# Patient Record
Sex: Female | Born: 1960 | Race: White | Hispanic: No | Marital: Single | Smoking: Never smoker
Health system: Southern US, Community
[De-identification: ages and names within clinical notes are randomized; demographics above are authoritative.]

## PROBLEM LIST (undated history)

## (undated) DIAGNOSIS — Z973 Presence of spectacles and contact lenses: Secondary | ICD-10-CM

## (undated) DIAGNOSIS — G43909 Migraine, unspecified, not intractable, without status migrainosus: Secondary | ICD-10-CM

## (undated) DIAGNOSIS — M1990 Unspecified osteoarthritis, unspecified site: Secondary | ICD-10-CM

## (undated) DIAGNOSIS — N840 Polyp of corpus uteri: Secondary | ICD-10-CM

## (undated) DIAGNOSIS — J302 Other seasonal allergic rhinitis: Secondary | ICD-10-CM

## (undated) DIAGNOSIS — I493 Ventricular premature depolarization: Secondary | ICD-10-CM

## (undated) HISTORY — DX: Migraine, unspecified, not intractable, without status migrainosus: G43.909

## (undated) HISTORY — DX: Ventricular premature depolarization: I49.3

---

## 2002-02-10 ENCOUNTER — Other Ambulatory Visit: Admission: RE | Admit: 2002-02-10 | Discharge: 2002-02-10 | Payer: Self-pay | Admitting: Obstetrics and Gynecology

## 2003-05-25 ENCOUNTER — Other Ambulatory Visit: Admission: RE | Admit: 2003-05-25 | Discharge: 2003-05-25 | Payer: Self-pay | Admitting: Obstetrics and Gynecology

## 2004-09-11 ENCOUNTER — Other Ambulatory Visit: Admission: RE | Admit: 2004-09-11 | Discharge: 2004-09-11 | Payer: Self-pay | Admitting: Obstetrics and Gynecology

## 2007-05-26 ENCOUNTER — Encounter: Admission: RE | Admit: 2007-05-26 | Discharge: 2007-05-26 | Payer: Self-pay | Admitting: Geriatric Medicine

## 2009-04-27 ENCOUNTER — Ambulatory Visit: Payer: Self-pay | Admitting: Sports Medicine

## 2009-04-27 DIAGNOSIS — M722 Plantar fascial fibromatosis: Secondary | ICD-10-CM

## 2009-04-27 DIAGNOSIS — M7750 Other enthesopathy of unspecified foot: Secondary | ICD-10-CM | POA: Insufficient documentation

## 2011-06-06 ENCOUNTER — Encounter: Payer: Self-pay | Admitting: Internal Medicine

## 2011-06-06 ENCOUNTER — Ambulatory Visit (INDEPENDENT_AMBULATORY_CARE_PROVIDER_SITE_OTHER)
Admission: RE | Admit: 2011-06-06 | Discharge: 2011-06-06 | Disposition: A | Discharge status: Home / Self Care | Payer: BC Managed Care – PPO | Source: Ambulatory Visit | Attending: Internal Medicine | Admitting: Internal Medicine

## 2011-06-06 ENCOUNTER — Other Ambulatory Visit (INDEPENDENT_AMBULATORY_CARE_PROVIDER_SITE_OTHER): Payer: BC Managed Care – PPO

## 2011-06-06 ENCOUNTER — Ambulatory Visit (INDEPENDENT_AMBULATORY_CARE_PROVIDER_SITE_OTHER): Payer: BC Managed Care – PPO | Admitting: Internal Medicine

## 2011-06-06 ENCOUNTER — Encounter: Payer: Self-pay | Admitting: *Deleted

## 2011-06-06 VITALS — BP 132/88 | HR 90 | Temp 97.0°F | Ht 67.5 in | Wt 245.8 lb

## 2011-06-06 DIAGNOSIS — R0609 Other forms of dyspnea: Secondary | ICD-10-CM

## 2011-06-06 DIAGNOSIS — R0600 Dyspnea, unspecified: Secondary | ICD-10-CM

## 2011-06-06 DIAGNOSIS — R0989 Other specified symptoms and signs involving the circulatory and respiratory systems: Secondary | ICD-10-CM

## 2011-06-06 LAB — CBC WITH DIFFERENTIAL/PLATELET
Basophils Relative: 0.9 % (ref 0.0–3.0)
Eosinophils Absolute: 0.2 10*3/uL (ref 0.0–0.7)
HCT: 40 % (ref 36.0–46.0)
Lymphocytes Relative: 32.2 % (ref 12.0–46.0)
Lymphs Abs: 2.2 10*3/uL (ref 0.7–4.0)
MCHC: 34.2 g/dL (ref 30.0–36.0)
Monocytes Absolute: 0.4 10*3/uL (ref 0.1–1.0)
RBC: 4.45 Mil/uL (ref 3.87–5.11)

## 2011-06-06 LAB — BASIC METABOLIC PANEL
BUN: 23 mg/dL (ref 6–23)
GFR: 64.75 mL/min (ref >60.00)
Sodium: 139 mEq/L (ref 135–145)

## 2011-06-06 LAB — URINALYSIS
Hgb urine dipstick: NEGATIVE
Ketones, ur: NEGATIVE
Specific Gravity, Urine: 1.025 (ref 1.000–1.030)
Total Protein, Urine: NEGATIVE
Urobilinogen, UA: 0.2 (ref 0.0–1.0)
pH: 6 (ref 5.0–8.0)

## 2011-06-06 LAB — SEDIMENTATION RATE: Sed Rate: 17 mm/hr (ref 0–22)

## 2011-06-06 LAB — BRAIN NATRIURETIC PEPTIDE: Pro B Natriuretic peptide (BNP): 18 pg/mL (ref 0.0–100.0)

## 2011-06-06 IMAGING — CR DG CHEST 2V
2 series · 2 of 2 positions shown · non-contrast
Comparison: None

CLINICAL DATA: Hemoptysis

CHEST - 2 VIEW

[view not recorded (1 of 2)]
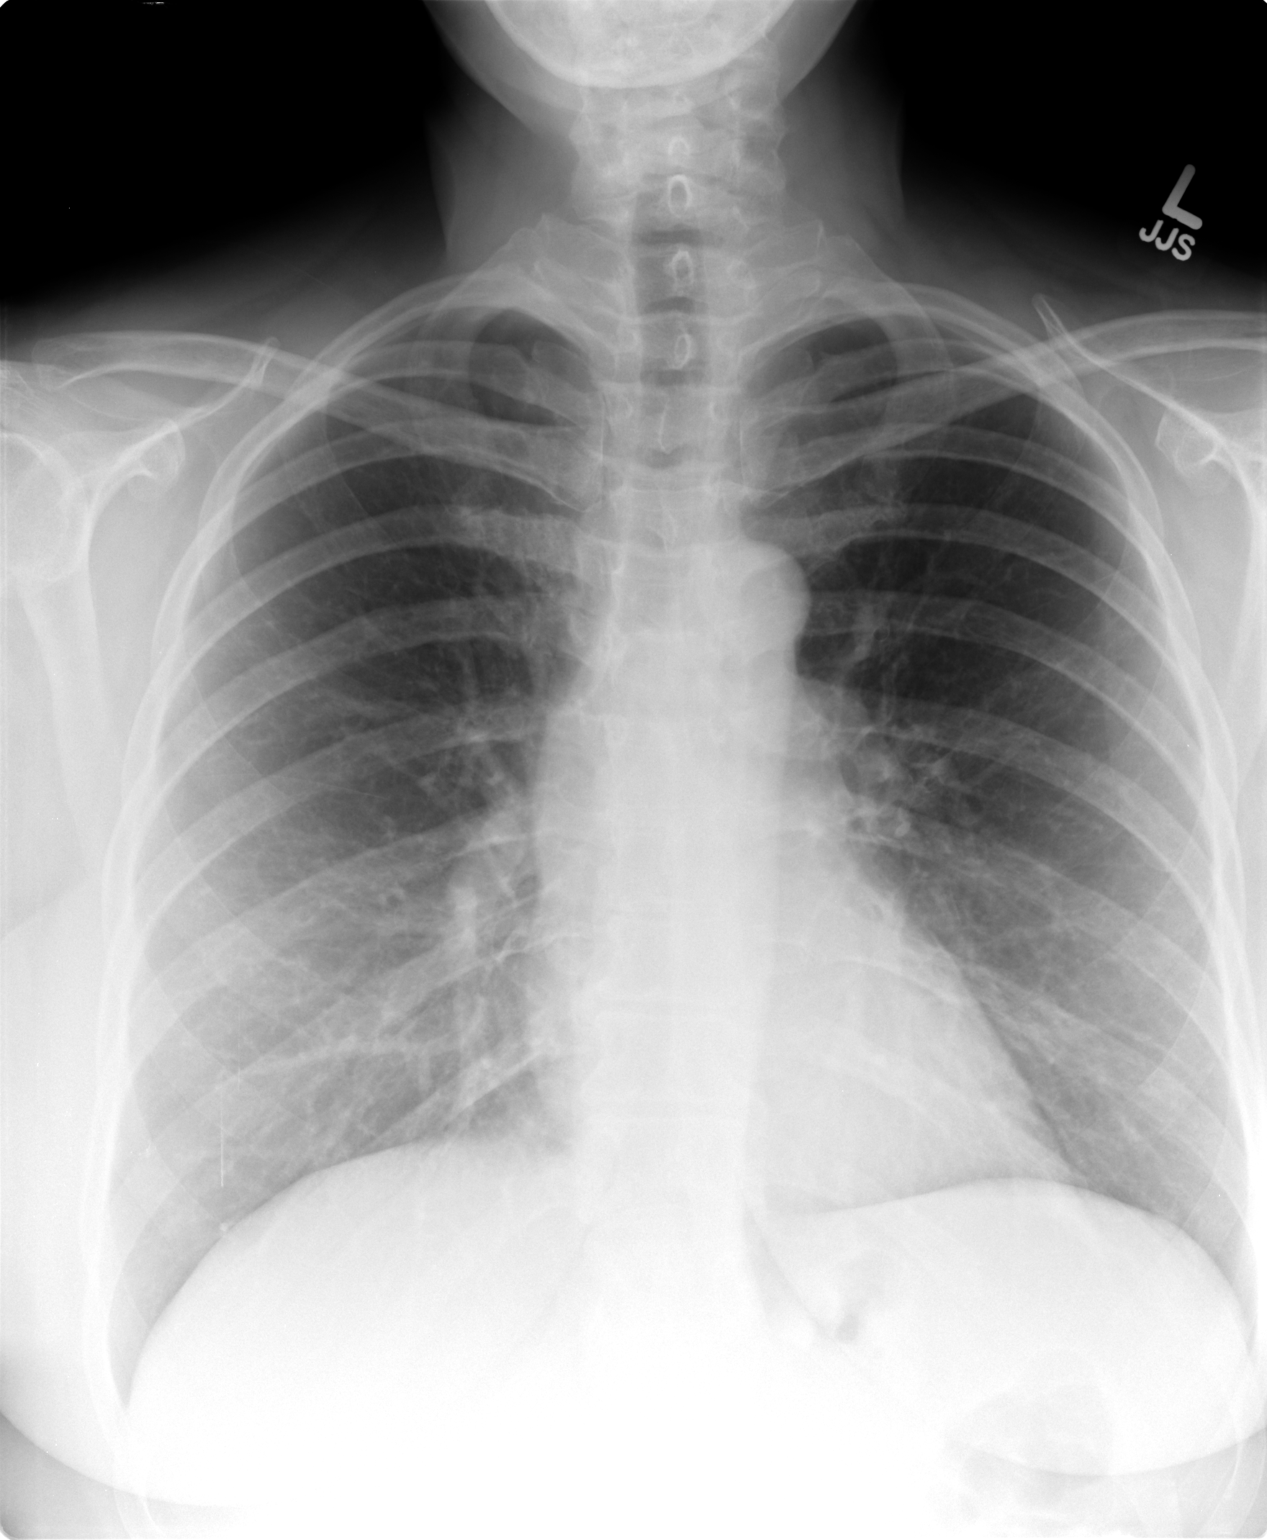

[view not recorded (2 of 2)]
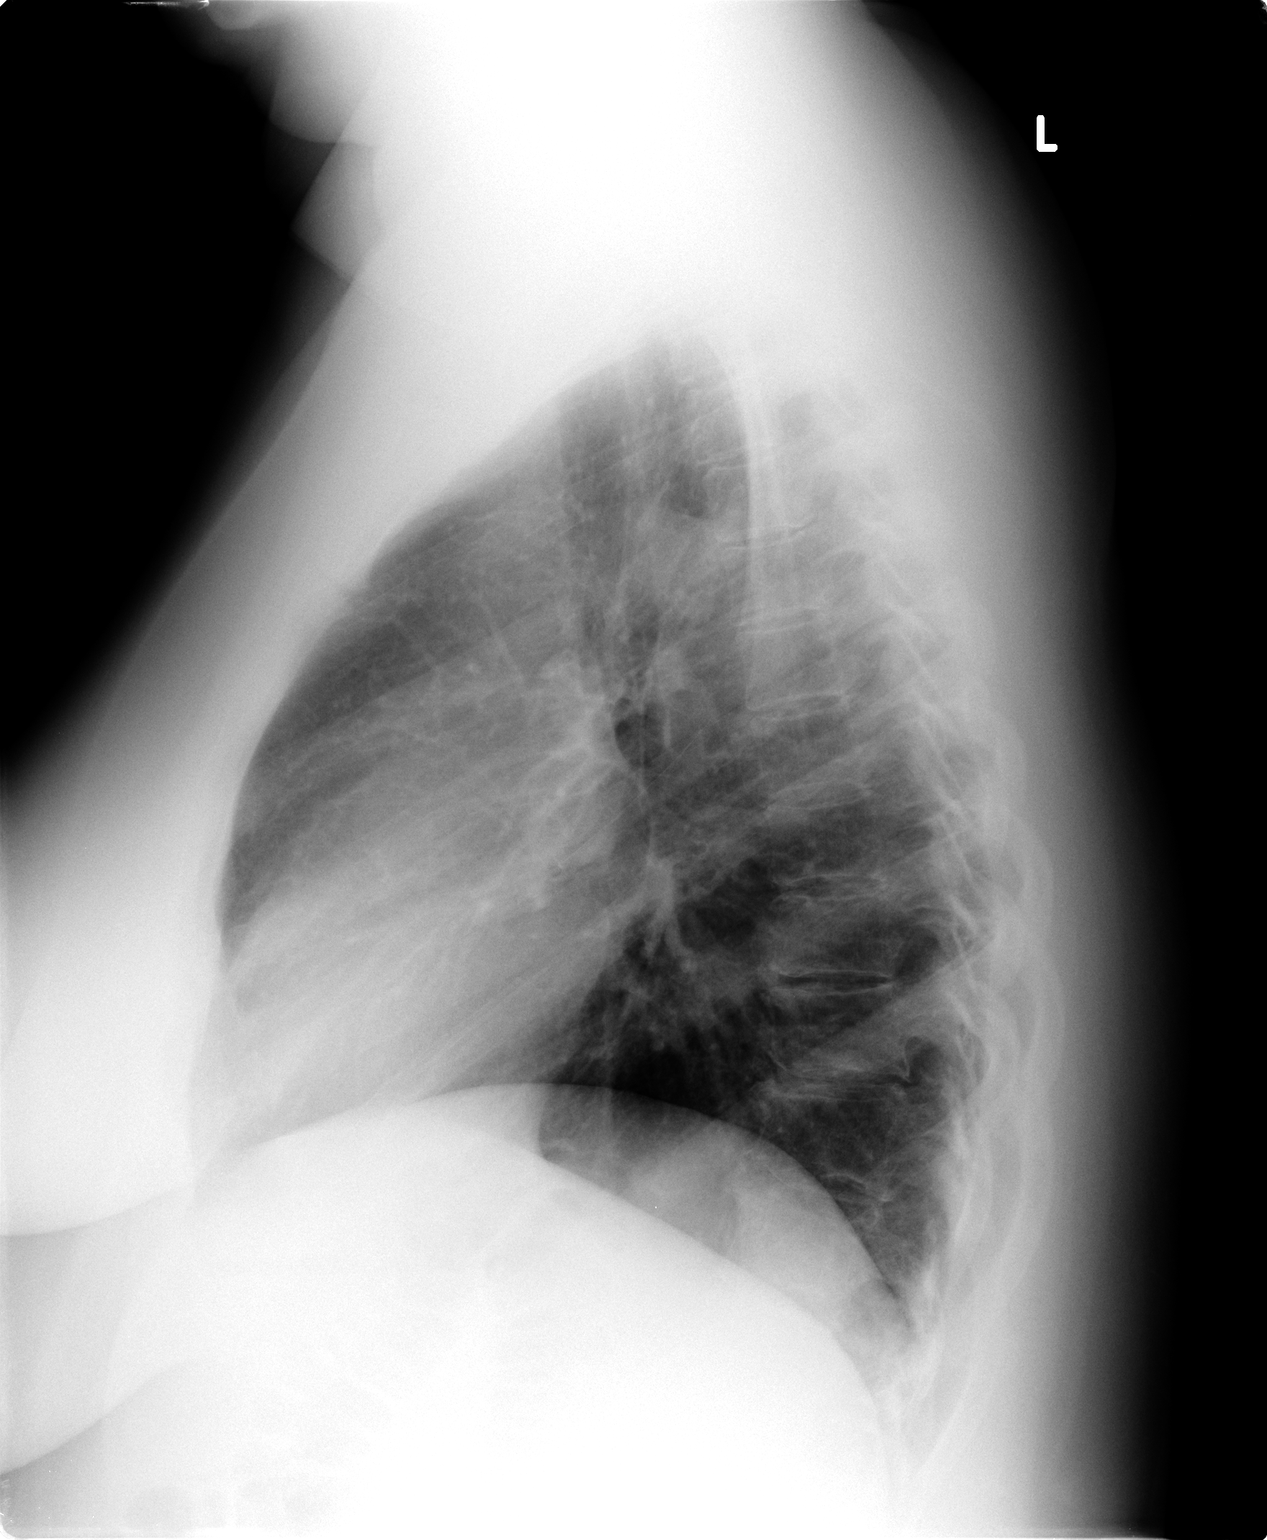

[2 of 2 positions shown; findings below may reference images not displayed]

FINDINGS: The heart size and mediastinal contours are within normal
limits.  Both lungs are clear.  The visualized skeletal structures
are unremarkable.
IMPRESSION: No active disease.

## 2011-06-06 MED ORDER — PREDNISONE (PAK) 10 MG PO TABS: ORAL_TABLET | ORAL | Status: AC

## 2011-06-06 NOTE — Progress Notes (Signed)
  Subjective:    Patient ID: Crystal Barber, female    DOB: 1961-03-29, 50 y.o.   MRN: [id number]  HPI  50 yowf from [place] never smoker but allergies since childhood on shots until teens never completely better then mostly seasonal eyes, nasal drainage able to stop medications summer and winter and evaluated by allergist 2011 pos cats mold self referred to pulmonary clinic for new breathing problems since Sept 2012  06/06/2011 Initial pulmonary office eval cc Sept p swimming x 3 weeks building up to 60 min on Oct 6th then 5 h later felt like the flu with aches/ chills/ coughing and wheezy typically worse p swimming and then symptoms all tapered off by Oct 13th reswam x  50 min felt fine then 5 hours exactly the same symptoms x coughed up blood sev tsps> better but still feels wheezy. Saw allergist by [facility] and" 6 rx's"  did not take any of them x nasonex and zyrtec > 75% better at day of ov. Assoc with nasal congestion but not epistaxis and no doe.  Sleeping ok without nocturnal  or early am exacerbation  of respiratory  c/o's or need for noct saba. Also denies any obvious fluctuation of symptoms with weather or environmental changes or other aggravating or alleviating factors except as outlined above      Review of Systems  Constitutional: Negative for fever, chills and unexpected weight change.  HENT: Positive for congestion. Negative for ear pain, nosebleeds, sore throat, rhinorrhea, sneezing, trouble swallowing, dental problem, voice change, postnasal drip and sinus pressure.   Eyes: Negative for visual disturbance.  Respiratory: Positive for cough and shortness of breath. Negative for choking.   Cardiovascular: Negative for chest pain and leg swelling.  Gastrointestinal: Negative for vomiting, abdominal pain and diarrhea.  Genitourinary: Negative for difficulty urinating.  Musculoskeletal: Negative for arthralgias.  Skin: Negative for rash.  Neurological: Negative for  tremors, syncope and headaches.  Hematological: Does not bruise/bleed easily.       Objective:   Physical Exam  amb tense wf nad  Wt 245 06/06/11 HEENT: nl dentition, turbinates, and orophanx. Nl external ear canals without cough reflex   NECK :  without JVD/Nodes/TM/ nl carotid upstrokes bilaterally   LUNGS: no acc muscle use, clear to A and P bilaterally without cough on insp or exp maneuvers   CV:  RRR  no s3 or murmur or increase in P2, no edema   ABD:  soft and nontender with nl excursion in the supine position. No bruits or organomegaly, bowel sounds nl  MS:  warm without deformities, calf tenderness, cyanosis or clubbing  SKIN: warm and dry without lesions    NEURO:  alert, approp, no deficits   cxr 06/06/11 No active disease.  ESR, Eos, u/a all neg      Assessment & Plan:

## 2011-06-06 NOTE — Patient Instructions (Addendum)
Please remember to go to the lab and x-ray department downstairs for your tests - we will call you with the results when then are available.   Try prilosec 20mg   Take 30-60 min before first meal of the day and Pepcid 20 mg one bedtime until cough is completely gone for at least a week without the need for cough suppression  I think of reflux for chronic cough like I do oxygen for fire (doesn't cause the fire but once you get the oxygen suppressed it usually goes away regardless of the exact cause).   GERD (REFLUX)  is an extremely common cause of respiratory symptoms, many times with no significant heartburn at all.    It can be treated with medication, but also with lifestyle changes including avoidance of late meals, excessive alcohol, smoking cessation, and avoid fatty foods, chocolate, peppermint, colas, red wine, and acidic juices such as orange juice.  NO MINT OR MENTHOL PRODUCTS SO NO COUGH DROPS  USE SUGARLESS CANDY INSTEAD (jolley ranchers or Stover's)  NO OIL BASED VITAMINS - use powdered substitutes - NO FISH OIL  If your condition worsens at all  Prednisone 10 mg take  4 each am x 2 days,   2 each am x 2 days,  1 each am x2days and stop   I agree you should not expose yourself to the pool at [facility] - see letter.  Return for any flare of symptoms

## 2011-06-07 ENCOUNTER — Telehealth: Payer: Self-pay | Admitting: Internal Medicine

## 2011-06-07 NOTE — Telephone Encounter (Signed)
Pt aware of lab and cxr results per dr wert's result notes--all labs and cxr are normal, no changes in recs made at [facility]

## 2011-06-07 NOTE — Assessment & Plan Note (Addendum)
Reproducible severe pulmonary and systemic complaints p exposure to pool atmosphere ? Some form of HSP since there was a delay and the cxr though normal does exclude this dx.   Since still having some HS  " wheeze" > Agree with 6 days of prednisone and empiric rx of gerd short term but unless symptoms recur  Or flare with other forms of exercise the key  Here is to avoid this environment and find other forms of ex >  If recurs  cpst is the next step.  Note she is going to [place] next month and needs to pace herself at high altittudes and stay on gerd rx/ diet while there if possible to avoid confusion in interpretation of her symptoms.  See instructions for specific recommendations which were reviewed directly with the patient who was given a copy with highlighter outlining the key components.

## 2013-09-11 ENCOUNTER — Other Ambulatory Visit: Payer: Self-pay | Admitting: Obstetrics and Gynecology

## 2013-09-11 DIAGNOSIS — N6489 Other specified disorders of breast: Secondary | ICD-10-CM

## 2013-09-17 ENCOUNTER — Ambulatory Visit
Admission: RE | Admit: 2013-09-17 | Discharge: 2013-09-17 | Disposition: A | Discharge status: Home / Self Care | Payer: 59 | Source: Ambulatory Visit | Attending: Obstetrics and Gynecology | Admitting: Obstetrics and Gynecology

## 2013-09-17 DIAGNOSIS — N6489 Other specified disorders of breast: Secondary | ICD-10-CM

## 2013-09-17 IMAGING — US US BREAST*L* LIMITED INC AXILLA
1 series · 8 of 8 positions shown · non-contrast
Comparison: Previous examinations.

CLINICAL DATA: Left axillary fullness 4 weeks ago following an
upper respiratory tract infection. The fullness has subsequently
resolved.

EXAM:
DIGITAL DIAGNOSTIC  BILATERAL MAMMOGRAM WITH CAD
ULTRASOUND LEFT BREAST

[Series 1: us breast*left* limited inc axilla · 8 of 8 slices shown]
[im 1/8]
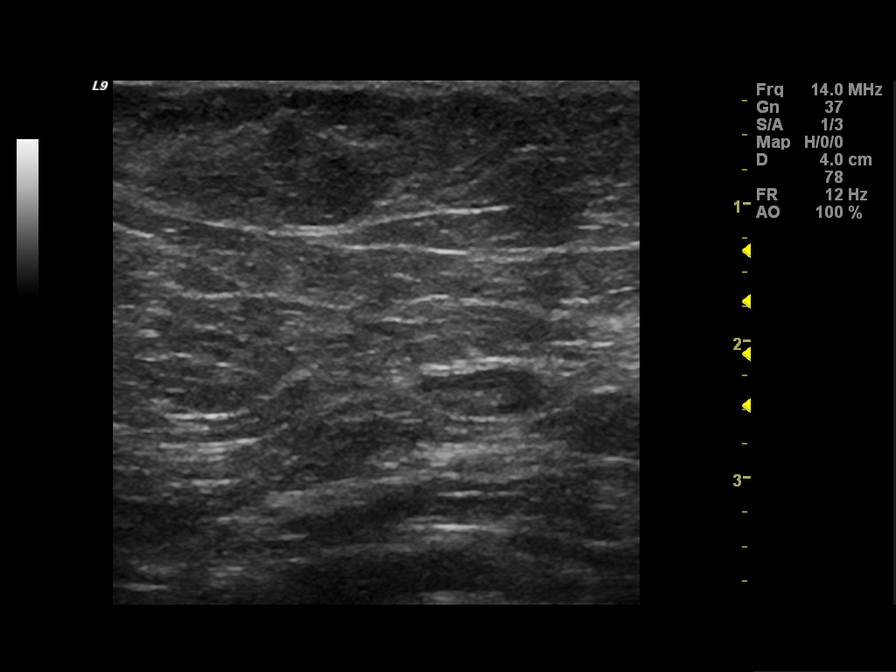
[im 2/8]
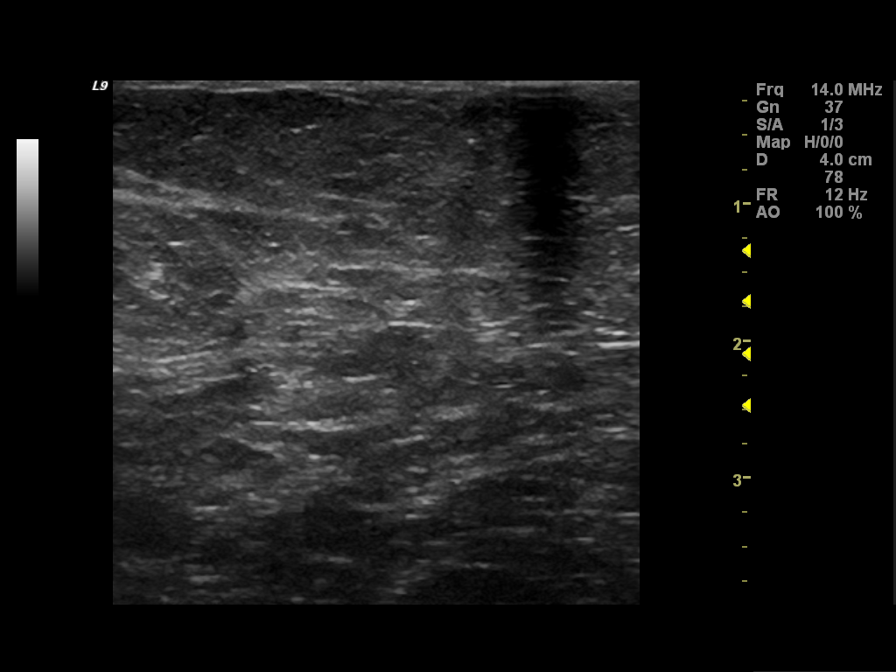
[im 3/8]
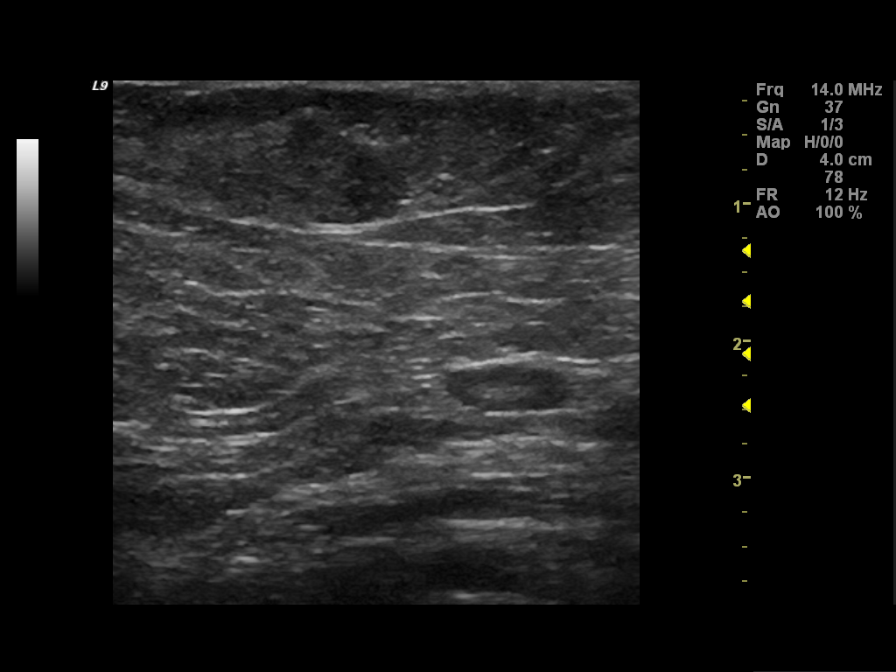
[im 4/8]
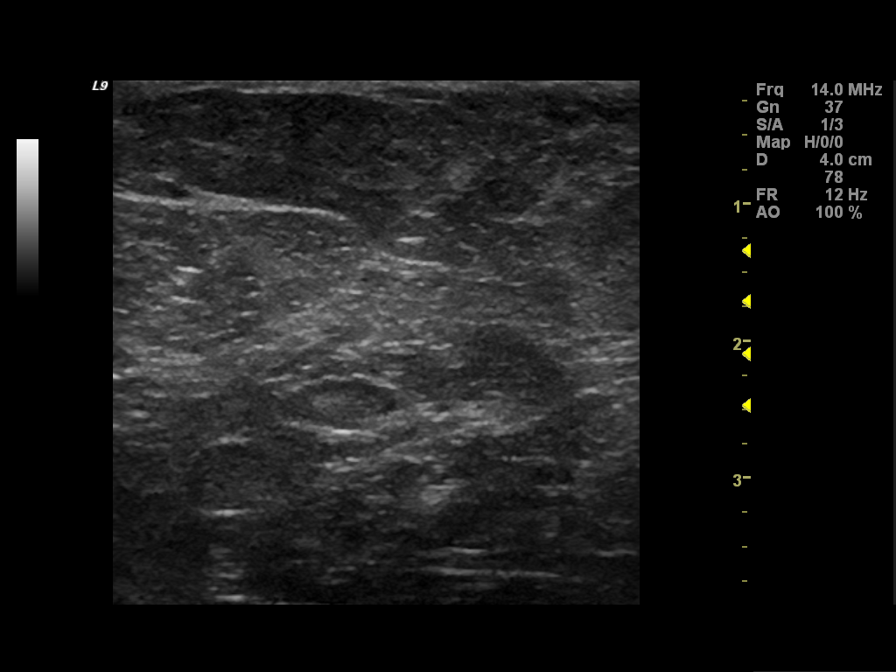
[im 5/8]
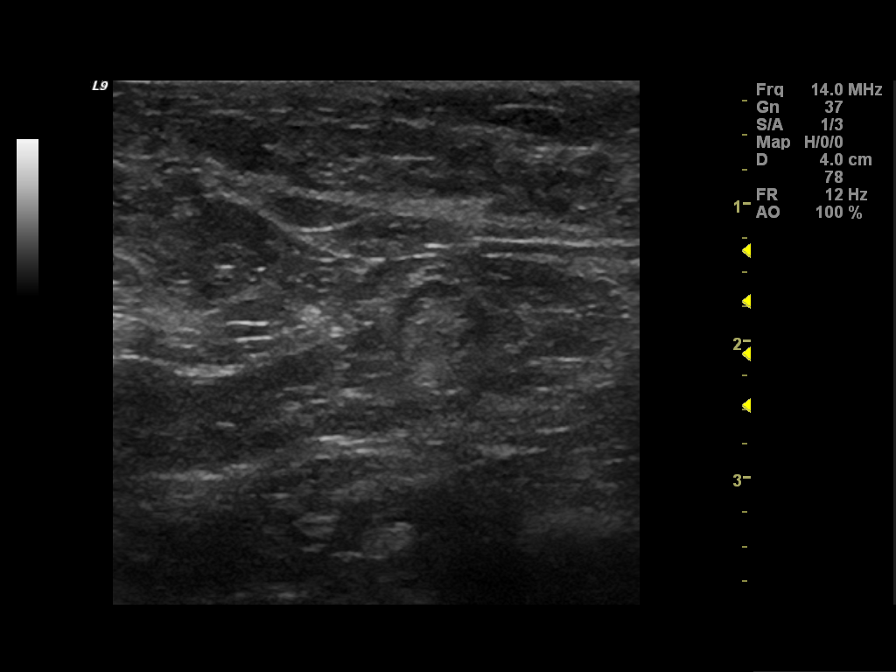
[im 6/8]
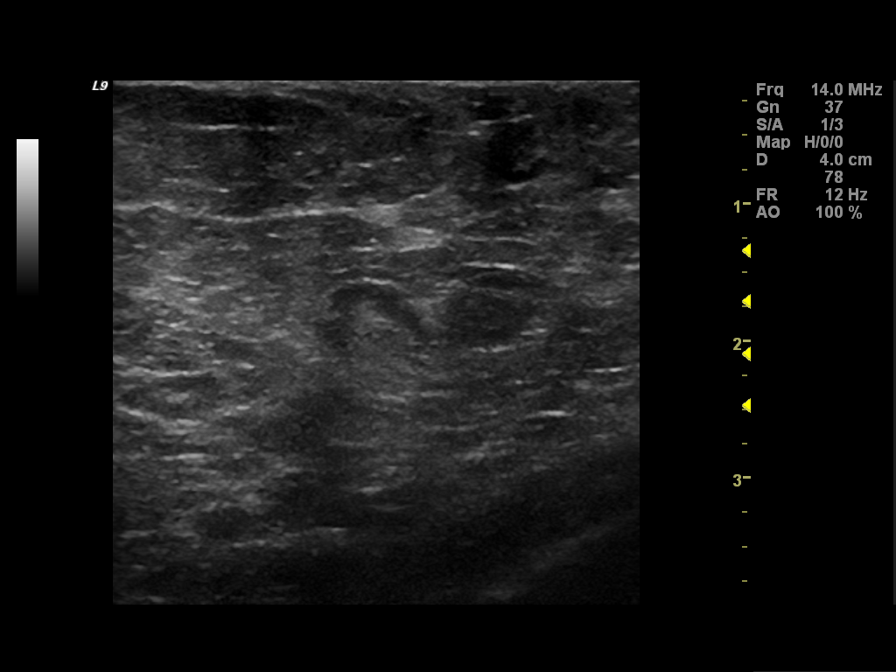
[im 7/8]
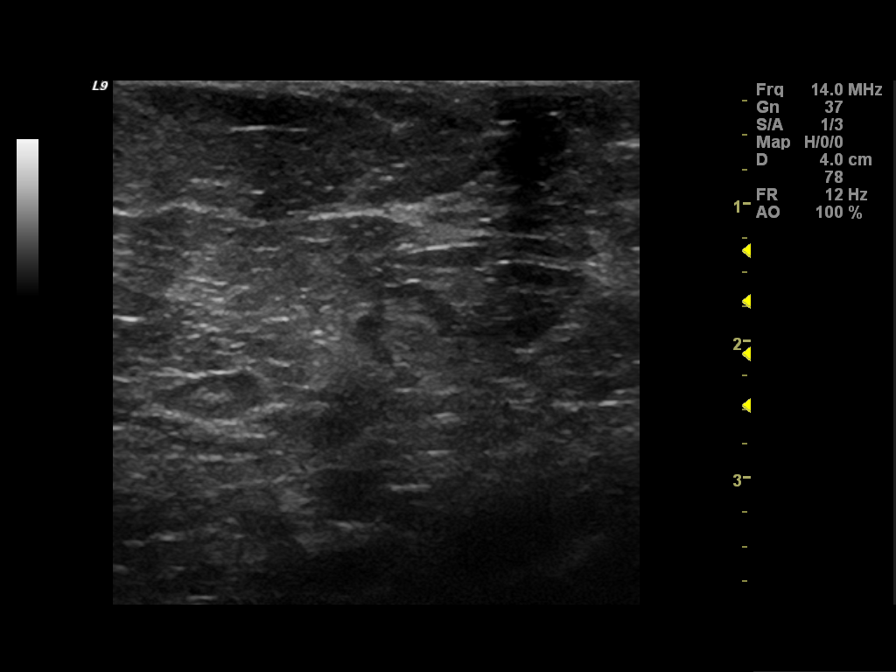
[im 8/8]
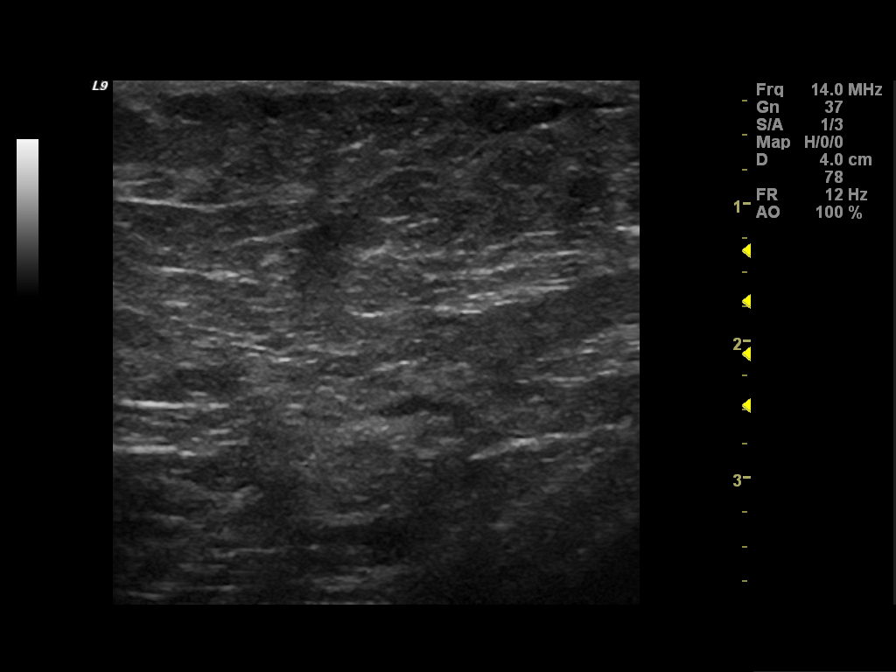

[8 of 8 positions shown; findings below may reference images not displayed]

ACR Breast Density Category d: The breast tissue is extremely dense,
which lowers the sensitivity of mammography.
FINDINGS: Stable mammographic appearance of the breasts with no findings
suspicious for malignancy. Stable normal appearing bilateral
axillary lymph nodes.

Mammographic images were processed with CAD.

On physical exam, no mass is palpable in the inferior left axilla in
the area of recent fullness. The patient is tender to palpation in
that region.

Ultrasound is performed, showing normal appearing left axillary
lymph nodes in the area of tenderness and recent fullness in the
inferior left axilla.
IMPRESSION: No evidence of malignancy.

RECOMMENDATION:
Bilateral screening mammogram in 1 year.

I have discussed the findings and recommendations with the patient.
Results were also provided in writing at the conclusion of the
visit. If applicable, a reminder letter will be sent to the patient
regarding the next appointment.

BI-RADS CATEGORY  1: Negative.

## 2013-09-17 IMAGING — MG MM DIGITAL DIAGNOSTIC BILAT CAD
5 series · 5 of 5 positions shown · non-contrast
Comparison: Previous examinations.

CLINICAL DATA: Left axillary fullness 4 weeks ago following an
upper respiratory tract infection. The fullness has subsequently
resolved.

EXAM:
DIGITAL DIAGNOSTIC  BILATERAL MAMMOGRAM WITH CAD
ULTRASOUND LEFT BREAST

[R CC]
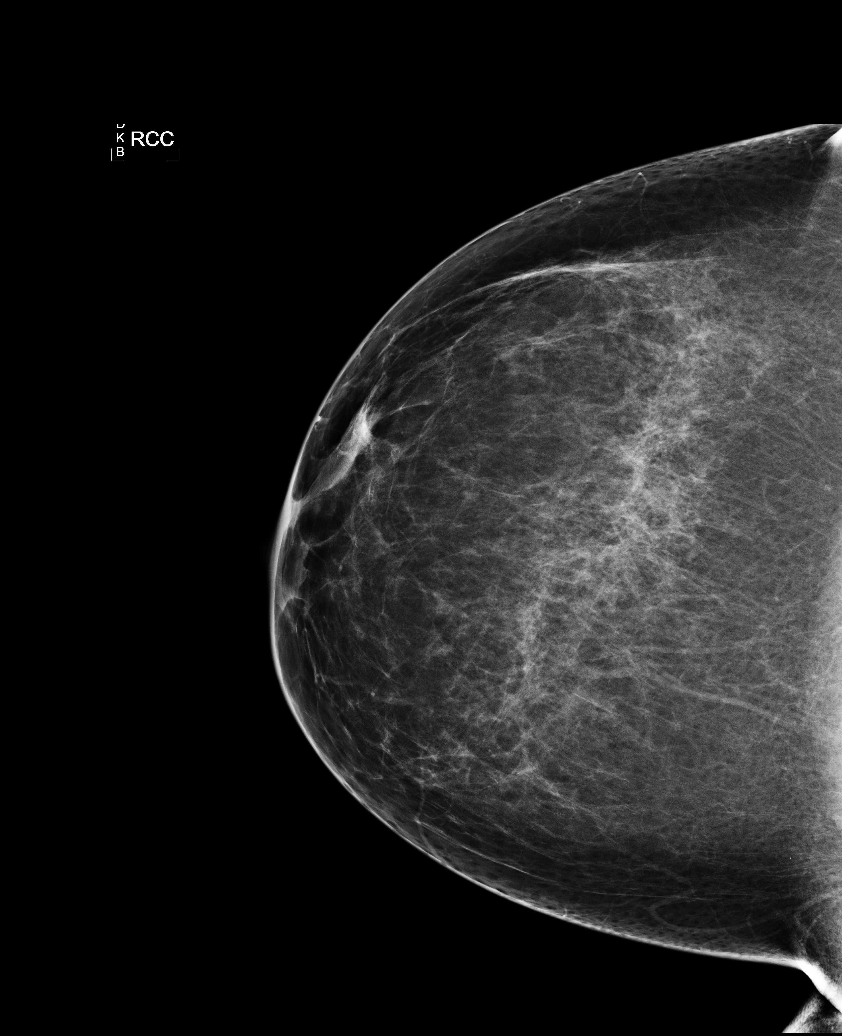

[L CC]
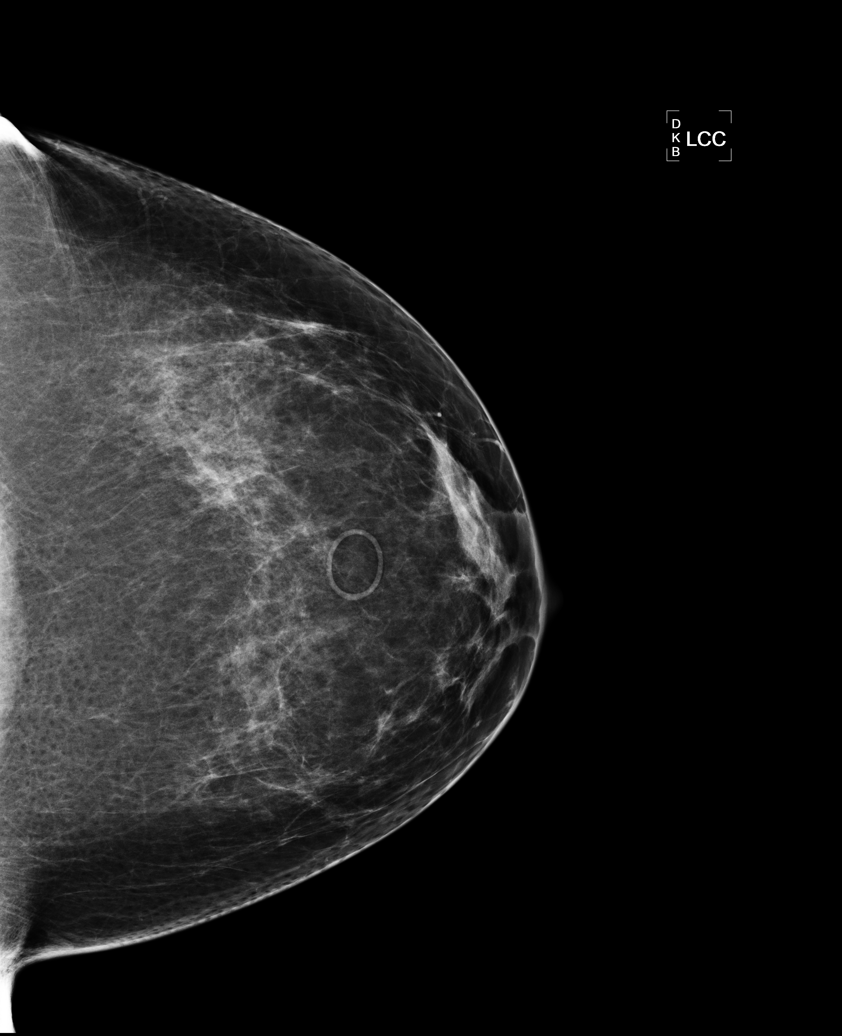

[L MLO]
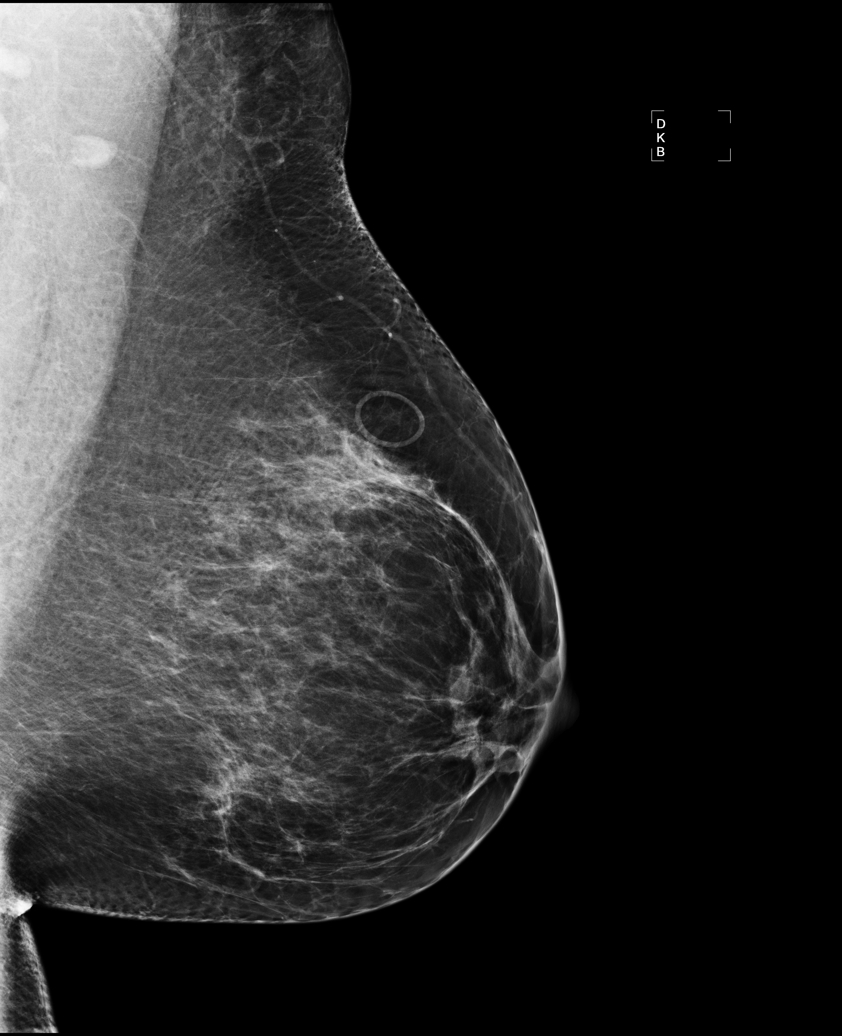

[R MLO]
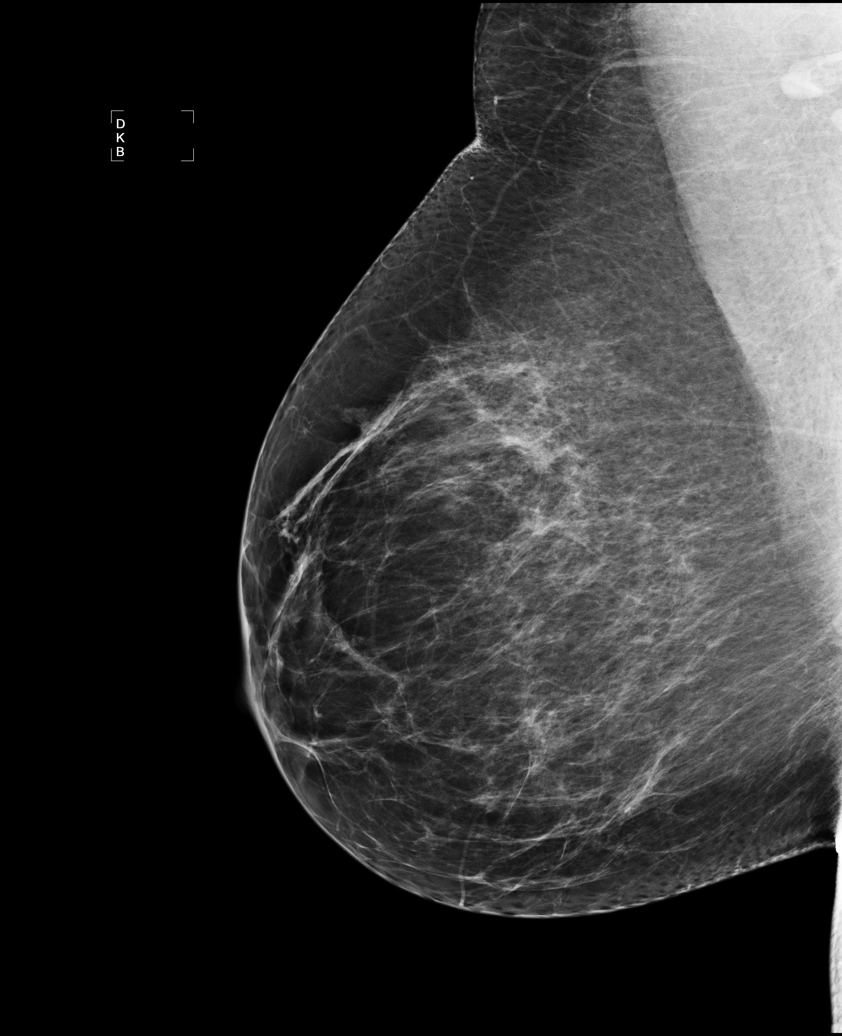

[L AT]
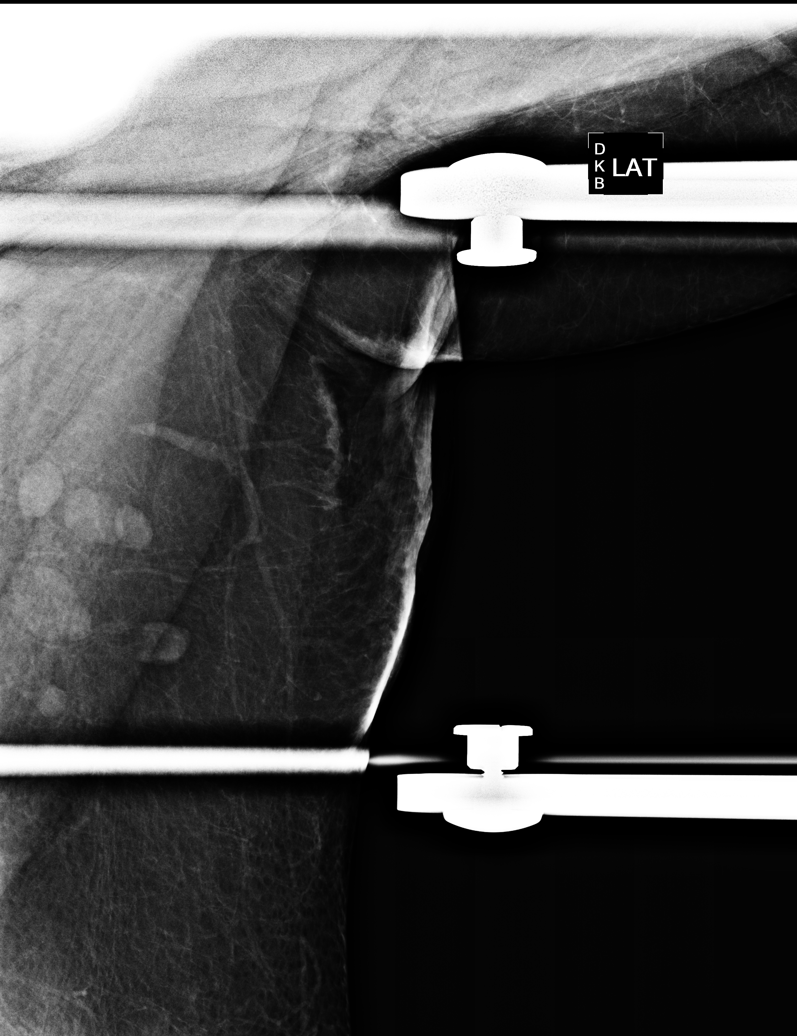

[5 of 5 positions shown; findings below may reference images not displayed]

ACR Breast Density Category d: The breast tissue is extremely dense,
which lowers the sensitivity of mammography.
FINDINGS: Stable mammographic appearance of the breasts with no findings
suspicious for malignancy. Stable normal appearing bilateral
axillary lymph nodes.

Mammographic images were processed with CAD.

On physical exam, no mass is palpable in the inferior left axilla in
the area of recent fullness. The patient is tender to palpation in
that region.

Ultrasound is performed, showing normal appearing left axillary
lymph nodes in the area of tenderness and recent fullness in the
inferior left axilla.
IMPRESSION: No evidence of malignancy.

RECOMMENDATION:
Bilateral screening mammogram in 1 year.

I have discussed the findings and recommendations with the patient.
Results were also provided in writing at the conclusion of the
visit. If applicable, a reminder letter will be sent to the patient
regarding the next appointment.

BI-RADS CATEGORY  1: Negative.

## 2013-12-04 HISTORY — PX: HYSTEROSCOPY: SHX211

## 2013-12-21 ENCOUNTER — Encounter (HOSPITAL_BASED_OUTPATIENT_CLINIC_OR_DEPARTMENT_OTHER): Payer: Self-pay | Admitting: *Deleted

## 2013-12-21 LAB — CBC
HCT: 40 % (ref 36.0–46.0)
HEMOGLOBIN: 13.9 g/dL (ref 12.0–15.0)
MCH: 30.3 pg (ref 26.0–34.0)
MCHC: 34.8 g/dL (ref 30.0–36.0)
MCV: 87.3 fL (ref 78.0–100.0)
Platelets: 191 10*3/uL (ref 150–400)
RBC: 4.58 MIL/uL (ref 3.87–5.11)
RDW: 12.7 % (ref 11.5–15.5)
WBC: 7.6 10*3/uL (ref 4.0–10.5)

## 2013-12-21 NOTE — Progress Notes (Signed)
NPO AFTER MN. ARRIVE AT 0600. PT WILL LAB WORK DONE TODAY.

## 2013-12-21 NOTE — H&P (Signed)
  Patient name Crystal Barber, Crystal Barber DICTATION#  [id number] CSN# [id number]  Darlyn Chamber, MD 12/21/2013 10:34 AM

## 2013-12-22 NOTE — H&P (Signed)
NAMETARAANN, OLTHOFF NO.:  [id number]  MEDICAL RECORD NO.:  [id number]  LOCATION:                                 FACILITY:  PHYSICIAN:  Darlyn Chamber, M.D.   DATE OF BIRTH:  05/04/1961  DATE OF ADMISSION:  12/24/2013 DATE OF DISCHARGE:                             HISTORY & PHYSICAL   DATE OF SURGERY:  Dec 24, 2013, at [facility] Outpatient area at [place]:  The patient is a 53 year old, gravida 0, para 0, postmenopausal patient presents for hysteroscopy and cervical dilation and uterine curettage.  In relation to the present admission, the patient is postmenopausal, did have an episode of postmenopausal bleeding.  She underwent a saline infusion ultrasound here in the office, and we did find a large endometrial polyp.  Both adnexa are unremarkable.  She now presents for hysteroscopy.  ALLERGIES:  She has no known drug allergies.  MEDICATIONS:  Zyrtec and Nasonex.  OB HISTORY:  No past obstetrical surgical history.  PAST HISTORY:  No significant medical issues besides migraine headaches.  FAMILY HISTORY:  Noncontributory.  SOCIAL HISTORY:  No tobacco.  Occasional alcohol use.  REVIEW OF SYSTEMS:  Noncontributory.  PHYSICAL EXAMINATION:  VITAL SIGNS:  The patient is afebrile, stable vital signs. HEENT:  The is patient is normocephalic.  Pupils equal, round, reactive to light and accommodation.  Extraocular movements were intact.  Sclerae clear.  Oropharynx clear. NECK:  Without thyromegaly. BREASTS:  Not examined. LUNGS:  Clear. CARDIOVASCULAR:  Regular rhythm, rate without murmurs or gallops.  No carotid or abdominal bruits. ABDOMEN:  Benign.  No mass, organomegaly, or tenderness. PELVIC:  Normal external genitalia.  Vaginal mucosa is clear.  Cervix unremarkable.  Uterus normal size, shape, and contour.  Adnexa free of mass or tenderness. EXTREMITIES:  Trace edema. NEUROLOGIC:  Grossly normal  limits.  IMPRESSION:  Postmenopausal bleeding with endometrial polyp.  PLAN:  The patient to undergo cervical dilatation along with hysteroscopic resection of endometrial polyp.  At the same time we will do urine curettings.  The nature and risks of procedure have been discussed including the risk of infection, the risk of vascular injury could lead to hemorrhage requiring transfusion with the risk of AIDS or hepatitis.  Excessive bleeding could require hysterectomy.  There is a risk of perforation with injury to adjacent organs such as bladder, bowel, or ureters that could require further exploratory surgery.  Risk of deep venous thrombosis and pulmonary embolus.  The patient expressed understanding of the indications and potential risks.     Darlyn Chamber, M.D.     JSM/MEDQ  D:  12/21/2013  T:  12/21/2013  Job:  [id number]

## 2013-12-23 NOTE — Anesthesia Preprocedure Evaluation (Addendum)
Anesthesia Evaluation  Patient identified by MRN, date of birth, ID band Patient awake    Reviewed: Allergy & Precautions, H&P , NPO status , Patient's Chart, lab work & pertinent test results  Airway Mallampati: II TM Distance: >3 FB Neck ROM: full    Dental no notable dental hx. (+) Teeth Intact, Dental Advisory Given Veneers on all upper front teeth:   Pulmonary neg pulmonary ROS,  breath sounds clear to auscultation  Pulmonary exam normal       Cardiovascular Exercise Tolerance: Good negative cardio ROS  Rhythm:regular Rate:Normal     Neuro/Psych negative neurological ROS  negative psych ROS   GI/Hepatic negative GI ROS, Neg liver ROS,   Endo/Other  negative endocrine ROS  Renal/GU negative Renal ROS  negative genitourinary   Musculoskeletal   Abdominal   Peds  Hematology negative hematology ROS (+)   Anesthesia Other Findings   Reproductive/Obstetrics negative OB ROS                          Anesthesia Physical Anesthesia Plan  ASA: II  Anesthesia Plan: General   Post-op Pain Management:    Induction: Intravenous  Airway Management Planned: LMA  Additional Equipment:   Intra-op Plan:   Post-operative Plan:   Informed Consent: I have reviewed the patients History and Physical, chart, labs and discussed the procedure including the risks, benefits and alternatives for the proposed anesthesia with the patient or authorized representative who has indicated his/her understanding and acceptance.   Dental Advisory Given  Plan Discussed with: CRNA and Surgeon  Anesthesia Plan Comments:        Anesthesia Quick Evaluation

## 2013-12-24 ENCOUNTER — Ambulatory Visit (HOSPITAL_BASED_OUTPATIENT_CLINIC_OR_DEPARTMENT_OTHER)
Admission: RE | Admit: 2013-12-24 | Discharge: 2013-12-24 | Disposition: A | Discharge status: Home / Self Care | Payer: 59 | Source: Ambulatory Visit | Attending: Obstetrics and Gynecology | Admitting: Obstetrics and Gynecology

## 2013-12-24 ENCOUNTER — Encounter (HOSPITAL_BASED_OUTPATIENT_CLINIC_OR_DEPARTMENT_OTHER)
Admission: RE | Disposition: A | Discharge status: Home / Self Care | Payer: Self-pay | Source: Ambulatory Visit | Attending: Obstetrics and Gynecology

## 2013-12-24 ENCOUNTER — Encounter (HOSPITAL_BASED_OUTPATIENT_CLINIC_OR_DEPARTMENT_OTHER): Payer: Self-pay | Admitting: *Deleted

## 2013-12-24 ENCOUNTER — Encounter (HOSPITAL_BASED_OUTPATIENT_CLINIC_OR_DEPARTMENT_OTHER): Payer: 59 | Admitting: Anesthesiology

## 2013-12-24 ENCOUNTER — Ambulatory Visit (HOSPITAL_BASED_OUTPATIENT_CLINIC_OR_DEPARTMENT_OTHER): Payer: 59 | Admitting: Anesthesiology

## 2013-12-24 DIAGNOSIS — D250 Submucous leiomyoma of uterus: Secondary | ICD-10-CM

## 2013-12-24 DIAGNOSIS — D259 Leiomyoma of uterus, unspecified: Secondary | ICD-10-CM | POA: Insufficient documentation

## 2013-12-24 DIAGNOSIS — N950 Postmenopausal bleeding: Secondary | ICD-10-CM | POA: Insufficient documentation

## 2013-12-24 DIAGNOSIS — Z9889 Other specified postprocedural states: Secondary | ICD-10-CM

## 2013-12-24 DIAGNOSIS — N840 Polyp of corpus uteri: Secondary | ICD-10-CM | POA: Diagnosis present

## 2013-12-24 HISTORY — DX: Unspecified osteoarthritis, unspecified site: M19.90

## 2013-12-24 HISTORY — DX: Other seasonal allergic rhinitis: J30.2

## 2013-12-24 HISTORY — DX: Polyp of corpus uteri: N84.0

## 2013-12-24 HISTORY — PX: DILATATION & CURRETTAGE/HYSTEROSCOPY WITH RESECTOCOPE: SHX5572

## 2013-12-24 HISTORY — DX: Presence of spectacles and contact lenses: Z97.3

## 2013-12-24 SURGERY — DILATATION & CURETTAGE/HYSTEROSCOPY WITH RESECTOCOPE
Anesthesia: General | Site: Uterus

## 2013-12-24 MED ORDER — DEXAMETHASONE SODIUM PHOSPHATE 4 MG/ML IJ SOLN
INTRAMUSCULAR | Status: DC | PRN
  Administered 2013-12-24: 10 mg via INTRAVENOUS

## 2013-12-24 MED ORDER — MIDAZOLAM HCL 5 MG/5ML IJ SOLN
INTRAMUSCULAR | Status: DC | PRN
  Administered 2013-12-24 (×2): 1 mg via INTRAVENOUS

## 2013-12-24 MED ORDER — EPHEDRINE SULFATE 50 MG/ML IJ SOLN
INTRAMUSCULAR | Status: DC | PRN
  Administered 2013-12-24 (×2): 10 mg via INTRAVENOUS

## 2013-12-24 MED ORDER — GLYCINE 1.5 % IR SOLN
Status: DC | PRN
  Administered 2013-12-24: 3000 mL

## 2013-12-24 MED ORDER — FENTANYL CITRATE 0.05 MG/ML IJ SOLN
INTRAMUSCULAR | Status: AC
  Filled 2013-12-24: qty 4

## 2013-12-24 MED ORDER — ONDANSETRON HCL 4 MG/2ML IJ SOLN
INTRAMUSCULAR | Status: DC | PRN
  Administered 2013-12-24: 4 mg via INTRAVENOUS

## 2013-12-24 MED ORDER — CEFAZOLIN SODIUM-DEXTROSE 2-3 GM-% IV SOLR
2.0000 g | INTRAVENOUS | Status: AC
  Administered 2013-12-24: 2 g via INTRAVENOUS
  Filled 2013-12-24: qty 50

## 2013-12-24 MED ORDER — KETOROLAC TROMETHAMINE 30 MG/ML IJ SOLN
INTRAMUSCULAR | Status: DC | PRN
  Administered 2013-12-24: 30 mg via INTRAVENOUS

## 2013-12-24 MED ORDER — MIDAZOLAM HCL 2 MG/2ML IJ SOLN
INTRAMUSCULAR | Status: AC
  Filled 2013-12-24: qty 4

## 2013-12-24 MED ORDER — PROPOFOL 10 MG/ML IV BOLUS
INTRAVENOUS | Status: DC | PRN
  Administered 2013-12-24: 200 mg via INTRAVENOUS
  Administered 2013-12-24: 50 mg via INTRAVENOUS

## 2013-12-24 MED ORDER — LIDOCAINE-EPINEPHRINE 1 %-1:100000 IJ SOLN
INTRAMUSCULAR | Status: DC | PRN
  Administered 2013-12-24: 20 mL

## 2013-12-24 MED ORDER — LIDOCAINE HCL (CARDIAC) 20 MG/ML IV SOLN
INTRAVENOUS | Status: DC | PRN
  Administered 2013-12-24: 60 mg via INTRAVENOUS

## 2013-12-24 MED ORDER — FENTANYL CITRATE 0.05 MG/ML IJ SOLN
25.0000 ug | INTRAMUSCULAR | Status: DC | PRN
  Filled 2013-12-24: qty 1

## 2013-12-24 MED ORDER — LACTATED RINGERS IV SOLN
INTRAVENOUS | Status: DC | PRN
  Administered 2013-12-24 (×2): via INTRAVENOUS

## 2013-12-24 MED ORDER — ACETAMINOPHEN 10 MG/ML IV SOLN
INTRAVENOUS | Status: DC | PRN
  Administered 2013-12-24: 1000 mg via INTRAVENOUS

## 2013-12-24 MED ORDER — LACTATED RINGERS IV SOLN
INTRAVENOUS | Status: DC
  Administered 2013-12-24: 07:00:00 via INTRAVENOUS
  Filled 2013-12-24: qty 1000

## 2013-12-24 MED ORDER — FENTANYL CITRATE 0.05 MG/ML IJ SOLN
INTRAMUSCULAR | Status: DC | PRN
  Administered 2013-12-24: 25 ug via INTRAVENOUS
  Administered 2013-12-24: 50 ug via INTRAVENOUS
  Administered 2013-12-24 (×5): 25 ug via INTRAVENOUS

## 2013-12-24 MED ORDER — GLYCOPYRROLATE 0.2 MG/ML IJ SOLN
INTRAMUSCULAR | Status: DC | PRN
  Administered 2013-12-24: 0.2 mg via INTRAVENOUS

## 2013-12-24 MED ORDER — LACTATED RINGERS IV SOLN
INTRAVENOUS | Status: DC
  Filled 2013-12-24: qty 1000

## 2013-12-24 SURGICAL SUPPLY — 34 items
CANISTER SUCTION 2500CC (MISCELLANEOUS) ×3 IMPLANT
CATH ROBINSON RED A/P 16FR (CATHETERS) ×2 IMPLANT
CLOTH BEACON ORANGE TIMEOUT ST (SAFETY) ×3 IMPLANT
CORD ACTIVE DISPOSABLE (ELECTRODE) ×2
CORD ELECTRO ACTIVE DISP (ELECTRODE) IMPLANT
COVER TABLE BACK 60X90 (DRAPES) ×3 IMPLANT
DRAPE CAMERA CLOSED 9X96 (DRAPES) ×3 IMPLANT
DRAPE HYSTEROSCOPY (DRAPE) ×3 IMPLANT
DRAPE LG THREE QUARTER DISP (DRAPES) ×3 IMPLANT
DRESSING TELFA 8X3 (GAUZE/BANDAGES/DRESSINGS) ×3 IMPLANT
ELECT LOOP GYNE PRO 24FR (CUTTING LOOP) ×3
ELECT REM PT RETURN 9FT ADLT (ELECTROSURGICAL) ×3
ELECTRODE LOOP GYNE PRO 24FR (CUTTING LOOP) ×1 IMPLANT
ELECTRODE REM PT RTRN 9FT ADLT (ELECTROSURGICAL) ×1 IMPLANT
GLOVE BIO SURGEON STRL SZ 6.5 (GLOVE) ×1 IMPLANT
GLOVE BIO SURGEON STRL SZ7 (GLOVE) ×6 IMPLANT
GLOVE BIO SURGEONS STRL SZ 6.5 (GLOVE) ×1
GLOVE INDICATOR 6.5 STRL GRN (GLOVE) ×2 IMPLANT
GLYCINE 1.5% IRRIG UROMATIC (IV SOLUTION) ×2 IMPLANT
GOWN STRL REUS W/ TWL LRG LVL3 (GOWN DISPOSABLE) IMPLANT
GOWN STRL REUS W/ TWL XL LVL3 (GOWN DISPOSABLE) IMPLANT
GOWN STRL REUS W/TWL LRG LVL3 (GOWN DISPOSABLE) ×3
GOWN STRL REUS W/TWL XL LVL3 (GOWN DISPOSABLE) ×3
LEGGING LITHOTOMY PAIR STRL (DRAPES) ×3 IMPLANT
NDL SPNL 22GX3.5 QUINCKE BK (NEEDLE) ×1 IMPLANT
NEEDLE SPNL 22GX3.5 QUINCKE BK (NEEDLE) ×3 IMPLANT
PACK BASIN DAY SURGERY FS (CUSTOM PROCEDURE TRAY) ×3 IMPLANT
PAD OB MATERNITY 4.3X12.25 (PERSONAL CARE ITEMS) ×3 IMPLANT
PAD PREP 24X48 CUFFED NSTRL (MISCELLANEOUS) ×3 IMPLANT
SET TUBING HYSTEROSCOPY 2 NDL (TUBING) ×3 IMPLANT
SYR CONTROL 10ML LL (SYRINGE) ×3 IMPLANT
TOWEL OR 17X24 6PK STRL BLUE (TOWEL DISPOSABLE) ×6 IMPLANT
TUBE HYSTEROSCOPY W Y-CONNECT (TUBING) ×5 IMPLANT
WATER STERILE IRR 500ML POUR (IV SOLUTION) ×1 IMPLANT

## 2013-12-24 NOTE — Discharge Instructions (Signed)

## 2013-12-24 NOTE — Op Note (Signed)
Preoperative diagnosis: Intrauterine polyps.   Postoperative diagnoses: Intrauterine polyps and submucosal fibroid.  Procedure: Paracervical block. Cervical dilation. Hysteroscopic resection of endometrial polyps and submucosal fibroid. Intrauterine curetting  Surgeon: Radene Knee   Anesthesia: Her cervical block with general anesthesia appear  Intraoperative blood replacement: None.  Complications: None.  Packs and drains: None.  Indications: As dictated in the history and physical.  Procedure: Patient was taken to the OR and placed in supine position. After satisfactory level of general anesthesia was obtained the patient was placed in the dorsal lithotomy position using the Allen stirrups. The perineum and vagina were prepped with Betadine and draped as a sterile field. SPECT was placed the vaginal vault. Anterior lip the cervix was grasped with a single-tooth tenaculum. Her cervical biopsy assisted to use 1% Xylocaine with epinephrine. Approximately 20 cc were used. Uterus sounded to 9 cm. Cervix was serially dilated to a size 35 Pratt dilator. Operative hysteroscope was introduced. Intrauterine cavity was distended using glycine. She had 2 polyps in the lower uterine segment. And then 8 intrauterine versus submucosal fibroid in the fundal and right side of the uterus. First resected both polyps. Then using multiple resections resected the fibroid. All specimens were sent for pathology. We did start seeing you deficit go up is to resect the fibroid. His was probably because of intravascular loss of the glycine. There was no signs of perforation is. Fibroid been completely resected total deficit was 500 cc. The hysteroscope was then removed. Endometrial curettings were obtained. He had no significant active bleeding no signs of perforation. The patient taken out of dorsal lithotomy position once alert and extubated transferred to the recovery room in good condition. Again total deficit was 500 cc.  Punch instrument and needle count was is correct by the circulating nurse x2.

## 2013-12-24 NOTE — H&P (Signed)
  History and physical exam unchanged 

## 2013-12-24 NOTE — Anesthesia Procedure Notes (Signed)
Procedure Name: LMA Insertion Date/Time: 12/24/2013 7:31 AM Performed by: Justice Rocher Pre-anesthesia Checklist: Patient identified, Emergency Drugs available, Suction available and Patient being monitored Patient Re-evaluated:Patient Re-evaluated prior to inductionOxygen Delivery Method: Circle System Utilized Preoxygenation: Pre-oxygenation with 100% oxygen Intubation Type: IV induction Ventilation: Mask ventilation without difficulty LMA: LMA inserted LMA Size: 4.0 Number of attempts: 1 Airway Equipment and Method: bite block Placement Confirmation: positive ETCO2 Tube secured with: Tape Dental Injury: Teeth and Oropharynx as per pre-operative assessment

## 2013-12-24 NOTE — Transfer of Care (Signed)
Immediate Anesthesia Transfer of Care Note  Patient: Crystal Barber  Procedure(s) Performed: Procedure(s) (LRB): DILATATION & CURETTAGE/HYSTEROSCOPY WITH RESECTOCOPE (N/A)  Patient Location: PACU  Anesthesia Type: General  Level of Consciousness: awake, sedated, patient cooperative and responds to stimulation  Airway & Oxygen Therapy: Patient Spontanous Breathing and Patient connected to face mask oxygen  Post-op Assessment: Report given to PACU RN, Post -op Vital signs reviewed and stable and Patient moving all extremities  Post vital signs: Reviewed and stable  Complications: No apparent anesthesia complications

## 2013-12-24 NOTE — Brief Op Note (Signed)
12/24/2013  8:15 AM  PATIENT:  Crystal Barber  53 y.o. female  PRE-OPERATIVE DIAGNOSIS:  polyp  POST-OPERATIVE DIAGNOSIS:  polyps  PROCEDURE:  Procedure(s): DILATATION & CURETTAGE/HYSTEROSCOPY WITH RESECTOCOPE (N/A)  SURGEON:  Surgeon(s) and Role:    * Darlyn Chamber, MD - Primary  PHYSICIAN ASSISTANT:   ASSISTANTS: none   ANESTHESIA:   general and paracervical block  EBL:  Total I/O In: 1000 [I.V.:1000] Out: -   BLOOD ADMINISTERED:none  DRAINS: none   LOCAL MEDICATIONS USED:  XYLOCAINE   SPECIMEN:  Source of Specimen:  polyps, fibroid and currettings  DISPOSITION OF SPECIMEN:  PATHOLOGY  COUNTS:  YES  TOURNIQUET:  * No tourniquets in log *  DICTATION: .Dragon Dictation  PLAN OF CARE: Discharge to home after PACU  PATIENT DISPOSITION:  PACU - hemodynamically stable.   Delay start of Pharmacological VTE agent (>24hrs) due to surgical blood loss or risk of bleeding: not applicable

## 2013-12-24 NOTE — Anesthesia Postprocedure Evaluation (Signed)
  Anesthesia Post-op Note  Patient: Crystal Barber  Procedure(s) Performed: Procedure(s) (LRB): DILATATION & CURETTAGE/HYSTEROSCOPY WITH RESECTOCOPE (N/A)  Patient Location: PACU  Anesthesia Type: General  Level of Consciousness: awake and alert   Airway and Oxygen Therapy: Patient Spontanous Breathing  Post-op Pain: mild  Post-op Assessment: Post-op Vital signs reviewed, Patient's Cardiovascular Status Stable, Respiratory Function Stable, Patent Airway and No signs of Nausea or vomiting  Last Vitals:  Filed Vitals:   12/24/13 0854  BP:   Pulse: 71  Temp:   Resp: 14    Post-op Vital Signs: stable   Complications: No apparent anesthesia complications

## 2013-12-25 ENCOUNTER — Encounter (HOSPITAL_BASED_OUTPATIENT_CLINIC_OR_DEPARTMENT_OTHER): Payer: Self-pay | Admitting: Obstetrics and Gynecology

## 2014-04-22 ENCOUNTER — Encounter: Payer: Self-pay | Admitting: Neurology

## 2014-04-22 ENCOUNTER — Ambulatory Visit (INDEPENDENT_AMBULATORY_CARE_PROVIDER_SITE_OTHER): Payer: 59 | Admitting: Neurology

## 2014-04-22 ENCOUNTER — Encounter (INDEPENDENT_AMBULATORY_CARE_PROVIDER_SITE_OTHER): Payer: Self-pay

## 2014-04-22 VITALS — BP 99/70 | HR 71 | Temp 97.8°F | Ht 67.5 in | Wt 231.0 lb

## 2014-04-22 DIAGNOSIS — R519 Headache, unspecified: Secondary | ICD-10-CM

## 2014-04-22 DIAGNOSIS — R002 Palpitations: Secondary | ICD-10-CM

## 2014-04-22 DIAGNOSIS — I4949 Other premature depolarization: Secondary | ICD-10-CM

## 2014-04-22 DIAGNOSIS — I493 Ventricular premature depolarization: Secondary | ICD-10-CM

## 2014-04-22 DIAGNOSIS — R0989 Other specified symptoms and signs involving the circulatory and respiratory systems: Secondary | ICD-10-CM

## 2014-04-22 DIAGNOSIS — I499 Cardiac arrhythmia, unspecified: Secondary | ICD-10-CM

## 2014-04-22 DIAGNOSIS — G4710 Hypersomnia, unspecified: Secondary | ICD-10-CM

## 2014-04-22 DIAGNOSIS — R0609 Other forms of dyspnea: Secondary | ICD-10-CM

## 2014-04-22 DIAGNOSIS — R400 Somnolence: Secondary | ICD-10-CM

## 2014-04-22 DIAGNOSIS — R51 Headache: Secondary | ICD-10-CM

## 2014-04-22 DIAGNOSIS — R0683 Snoring: Secondary | ICD-10-CM

## 2014-04-22 DIAGNOSIS — I498 Other specified cardiac arrhythmias: Secondary | ICD-10-CM

## 2014-04-22 NOTE — Progress Notes (Signed)
Subjective:    Patient ID: Crystal Barber is a 53 y.o. female.  HPI    Star Age, MD, PhD [facility] [address], [address] [place], [place] [address]  Dear Crystal Barber,   I saw your patient, Crystal Barber, upon your kind request in my neurologic clinic today for initial consultation of her sleep disorder, in particular, concern for underlying obstructive sleep apnea. The patient is unaccompanied today. As you know, Crystal Barber is a 53 year old right-handed woman with an underlying medical history allergies, asthma, obesity, and palpitations with abnormal EKG noted in the recent past, who reports recent palpitations, frequent nighttime awakenings, snoring and morning headaches. She recently went to UC with palpitations and was found to have frequent PVCs, and bigemini. She has had 2 episodes several months ago, lasting for minutes, which resolved on their own. For cardiac workup she had a stress test and an echocardiogram, both negative. She is working on weight loss in the last year and is working with a Clinical research associate. She has a history of migraines, but much better post menopause. She had stress at work and in August had several morning headaches.  She drinks coffee, 2 cups in the morning and 1 in the afternoon, no sodas. She is a never-smoker, and drinks alcohol occasionally and works FT in the travel business.   Her typical bedtime is reported to be around 11:30 PM and usual wake time is around 5:30 AM. Sleep onset typically occurs within a few minutes. She reports feeling marginally rested upon awakening. She wakes up on an average 2 times in the middle of the night (often because of her cat in the bed), and has to go to the bathroom 0 to 1 times on a typical night. She usually sleeps alone and with her BF occasionally.   She reports excessive daytime somnolence (EDS) and Her Epworth Sleepiness Score (ESS) is 15/24 today. She has fallen asleep while at the wheel,  even when driving for about 90 minutes. She will use extra coffee or a 5 hour energy drink. The patient has not been taking a scheduled nap, but has napped off and on.  She has been known to snore for the past many years. Snoring is reportedly mild, and not clearly associated with choking sounds and witnessed apneas, but mostly she sleeps alone. The patient admits to a sense of choking or strangling feeling when she wakes up in the mornings. There is no report of nighttime reflux, with occasional nighttime cough experienced. The patient has not noted any RLS symptoms and is not known to kick while asleep or before falling asleep. There is no family history of RLS or OSA.   She denies cataplexy, sleep paralysis, hypnagogic or hypnopompic hallucinations, or sleep attacks. She does not report any vivid dreams, nightmares, dream enactments, or parasomnias, such as sleep talking or sleep walking, but has a history in the past of sleep talking. The patient has not had a sleep study or a home sleep test. Her bedroom is usually dark and cool. There is a TV in the bedroom and usually it is not on at night.   Her Past Medical History Is Significant For: Past Medical History  Diagnosis Date  . Seasonal allergies   . Arthritis     FOOT  . Wears contact lenses   . Endometrial polyp   . Migraine     Her Past Surgical History Is Significant For: Past Surgical History  Procedure Laterality Date  .  Dilatation & currettage/hysteroscopy with resectocope N/A 12/24/2013    Procedure: [place];  Surgeon: Darlyn Chamber, MD;  Location: [facility];  Service: Gynecology;  Laterality: N/A;  . Hysteroscopy  12/2013    Her Family History Is Significant For: Family History  Problem Relation Age of Onset  . Asthma Paternal Grandmother     Her Social History Is Significant For: History   Social History  . Marital Status: Single    Spouse Name: N/A     Number of Children: 0  . Years of Education: BS   Occupational History  . [employer]     Social History Main Topics  . Smoking status: Never Smoker   . Smokeless tobacco: Never Used  . Alcohol Use: 1.0 oz/week    2 drink(s) per week     Comment: OCCASIONAL,1-3 drinks weekly  . Drug Use: No  . Sexual Activity: Not on file   Other Topics Concern  . Not on file   Social History Narrative   Patient is right handed and resides alone,consumes 2-3 caffeinated beverages daily    Her Allergies Are:  No Known Allergies:   Her Current Medications Are:  Outpatient Encounter Prescriptions as of 04/22/2014  Medication Sig  . cetirizine (ZYRTEC) 10 MG tablet Take 10 mg by mouth daily.    Marland Kitchen ibuprofen (ADVIL,MOTRIN) 200 MG tablet Take 200 mg by mouth every 6 (six) hours as needed.  . [DISCONTINUED] Probiotic Product (PROBIOTIC DAILY PO) Take 1 capsule by mouth daily.  :  Review of Systems:  Out of a complete 14 point review of systems, all are reviewed and negative with the exception of these symptoms as listed below:   Review of Systems  Constitutional: Positive for fatigue.  Neurological: Positive for dizziness and headaches.       Sleepiness  Psychiatric/Behavioral:       Not enough sleep    Objective:  Neurologic Exam  Physical Exam Physical Examination:   Filed Vitals:   04/22/14 0832  BP: 99/70  Pulse: 71  Temp: 97.8 F (36.6 C)    General Examination: The patient is a very pleasant 53 y.o. female in no acute distress. She appears well-developed and well-nourished and well groomed.   HEENT: Normocephalic, atraumatic, pupils are equal, round and reactive to light and accommodation. Funduscopic exam is normal with sharp disc margins noted. Extraocular tracking is good without limitation to gaze excursion or nystagmus noted. Normal smooth pursuit is noted. Hearing is grossly intact. Tympanic membranes are clear bilaterally. Face is symmetric with normal facial  animation and normal facial sensation. Speech is clear with no dysarthria noted. There is no hypophonia. There is no lip, neck/head, jaw or voice tremor. Neck is supple with full range of passive and active motion. There are no carotid bruits on auscultation. Oropharynx exam reveals: mild mouth dryness, good dental hygiene and mild airway crowding, due to longer tongue and redundant soft palate. Mallampati is class II. Tongue protrudes centrally and palate elevates symmetrically. Tonsils are 1+ in size. Neck size is 14.75 inches. She has a tiny overbite. Nasal inspection reveals no significant nasal mucosal bogginess or redness and no septal deviation.   Chest: Clear to auscultation without wheezing, rhonchi or crackles noted.  Heart: S1+S2+0, regular and normal without murmurs, rubs or gallops noted.   Abdomen: Soft, non-tender and non-distended with normal bowel sounds appreciated on auscultation.  Extremities: There is no pitting edema in the distal lower extremities bilaterally. Pedal  pulses are intact.  Skin: Warm and dry without trophic changes noted. There are very mild varicose veins.  Musculoskeletal: exam reveals no obvious joint deformities, tenderness or joint swelling or erythema.   Neurologically:  Mental status: The patient is awake, alert and oriented in all 4 spheres. Her immediate and remote memory, attention, language skills and fund of knowledge are appropriate. There is no evidence of aphasia, agnosia, apraxia or anomia. Speech is clear with normal prosody and enunciation. Thought process is linear. Mood is normal and affect is normal.  Cranial nerves II - XII are as described above under HEENT exam. In addition: shoulder shrug is normal with equal shoulder height noted. Motor exam: Normal bulk, strength and tone is noted. There is no drift, tremor or rebound. Romberg is negative. Reflexes are 2+ throughout. Babinski: Toes are flexor bilaterally. Fine motor skills and  coordination: intact with normal finger taps, normal hand movements, normal rapid alternating patting, normal foot taps and normal foot agility.  Cerebellar testing: No dysmetria or intention tremor on finger to nose testing. Heel to shin is unremarkable bilaterally. There is no truncal or gait ataxia.  Sensory exam: intact to light touch, pinprick, vibration, temperature sense in the upper and lower extremities.  Gait, station and balance: She stands easily. No veering to one side is noted. No leaning to one side is noted. Posture is age-appropriate and stance is narrow based. Gait shows normal stride length and normal pace. No problems turning are noted. She turns en bloc. Tandem walk is unremarkable. Intact toe and heel stance is noted.               Assessment and Plan:   In summary, JAPNEET STAGGS is a very pleasant 53 y.o.-year old female with a history and physical exam concerning for obstructive sleep apnea (OSA), due to her report of nonrestorative sleep, significant daytime somnolence, morning headaches reported, palpitations and PVCs seen including bigeminy recently, her overweight state and her airway exam. I had a long chat with the patient about my findings and the diagnosis of OSA, its prognosis and treatment options. We talked about medical treatments, surgical interventions and non-pharmacological approaches. I explained in particular the risks and ramifications of untreated moderate to severe OSA, especially with respect to developing cardiovascular disease down the Road, including congestive heart failure, difficult to treat hypertension, cardiac arrhythmias, or stroke. Even type 2 diabetes has, in part, been linked to untreated OSA. Symptoms of untreated OSA include daytime sleepiness, memory problems, mood irritability and mood disorder such as depression and anxiety, lack of energy, as well as recurrent headaches, especially morning headaches. We talked about trying to maintain a  healthy lifestyle in general, as well as the importance of weight control. I encouraged the patient to eat healthy, exercise daily and keep well hydrated, to keep a scheduled bedtime and wake time routine, to not skip any meals and eat healthy snacks in between meals. I advised the patient not to drive when feeling sleepy. I recommended the following at this time: sleep study with potential positive airway pressure titration. (We will score hypopneas at 4% and split the sleep study into diagnostic and treatment portion, if the estimated. 2 hour AHI is >20/h).   I explained the sleep test procedure to the patient and also outlined possible surgical and non-surgical treatment options of OSA, including the use of a custom-made dental device (which would require a referral to a specialist dentist or oral surgeon), upper airway surgical options, such  as pillar implants, radiofrequency surgery, tongue base surgery, and UPPP (which would involve a referral to an ENT surgeon). Rarely, jaw surgery such as mandibular advancement may be considered.  I also explained the CPAP treatment option to the patient, who indicated that she would be willing to try CPAP if the need arises. I explained the importance of being compliant with PAP treatment, not only for insurance purposes but primarily to improve Her symptoms, and for the patient's long term health benefit, including to reduce Her cardiovascular risks. I answered all her questions today and the patient was in agreement. I would like to see her back after the sleep study is completed and encouraged her to call with any interim questions, concerns, problems or updates.   Thank you very much for allowing me to participate in the care of this nice patient. If I can be of any further assistance to you please do not hesitate to call me at [phone].  Sincerely,   Star Age, MD, PhD

## 2014-04-22 NOTE — Patient Instructions (Signed)

## 2014-05-17 ENCOUNTER — Telehealth: Payer: Self-pay | Admitting: Neurology

## 2014-05-17 DIAGNOSIS — R002 Palpitations: Secondary | ICD-10-CM

## 2014-05-17 DIAGNOSIS — R0683 Snoring: Secondary | ICD-10-CM

## 2014-05-17 DIAGNOSIS — E669 Obesity, unspecified: Secondary | ICD-10-CM

## 2014-05-17 DIAGNOSIS — I499 Cardiac arrhythmia, unspecified: Secondary | ICD-10-CM

## 2014-05-17 DIAGNOSIS — R400 Somnolence: Secondary | ICD-10-CM

## 2014-05-17 DIAGNOSIS — I493 Ventricular premature depolarization: Secondary | ICD-10-CM

## 2014-05-17 DIAGNOSIS — I498 Other specified cardiac arrhythmias: Secondary | ICD-10-CM

## 2014-05-17 NOTE — Telephone Encounter (Signed)
[employer] has denied the authorization request for an attended sleep study.  Please submit an order for HST.

## 2014-05-18 NOTE — Telephone Encounter (Signed)
This patient's insurance denied an attended sleep study. I will order home sleep test.  

## 2014-06-08 ENCOUNTER — Encounter: Payer: Self-pay | Admitting: Neurology

## 2016-04-18 ENCOUNTER — Telehealth: Payer: Self-pay | Admitting: Neurology

## 2016-04-18 NOTE — Telephone Encounter (Signed)
Patient is calling to discuss possibly having a home sleep study.

## 2016-04-18 NOTE — Telephone Encounter (Signed)
I called patient back, she states that she is ready to complete sleep study. She was last seen 2 years ago. I scheduled her a face to face visit with Dr. Rexene Alberts.

## 2016-05-07 ENCOUNTER — Telehealth: Payer: Self-pay | Admitting: Neurology

## 2016-05-07 ENCOUNTER — Ambulatory Visit (INDEPENDENT_AMBULATORY_CARE_PROVIDER_SITE_OTHER): Payer: 59 | Admitting: Neurology

## 2016-05-07 ENCOUNTER — Encounter: Payer: Self-pay | Admitting: Neurology

## 2016-05-07 VITALS — BP 110/68 | HR 78 | Resp 16 | Ht 67.5 in | Wt 226.0 lb

## 2016-05-07 DIAGNOSIS — R0683 Snoring: Secondary | ICD-10-CM

## 2016-05-07 DIAGNOSIS — R51 Headache: Secondary | ICD-10-CM | POA: Diagnosis not present

## 2016-05-07 DIAGNOSIS — R002 Palpitations: Secondary | ICD-10-CM | POA: Diagnosis not present

## 2016-05-07 DIAGNOSIS — I499 Cardiac arrhythmia, unspecified: Secondary | ICD-10-CM

## 2016-05-07 DIAGNOSIS — I498 Other specified cardiac arrhythmias: Secondary | ICD-10-CM

## 2016-05-07 DIAGNOSIS — G4710 Hypersomnia, unspecified: Secondary | ICD-10-CM

## 2016-05-07 DIAGNOSIS — I493 Ventricular premature depolarization: Secondary | ICD-10-CM

## 2016-05-07 DIAGNOSIS — R519 Headache, unspecified: Secondary | ICD-10-CM

## 2016-05-07 NOTE — Patient Instructions (Signed)

## 2016-05-07 NOTE — Telephone Encounter (Signed)
Patient called 8:54:57 to advise she is running behind and will be here in approximately 5 minutes for 9:00am appointment w/Dr. Rexene Alberts. Patient advised, she will still be seen up to 10 minutes past appointment, afterwards we will need to reschedule.

## 2016-05-07 NOTE — Progress Notes (Signed)
Subjective:    Patient ID: Crystal Barber is a 55 y.o. female.  HPI     Interim history:   Crystal Barber is a 55 year old right-handed woman with an underlying medical history allergies, asthma, obesity, and palpitations with abnormal EKG noted in the recent past, who presents for reevaluation of her sleep disturbance, particularly concern for underlying obstructive sleep apnea. The patient is unaccompanied today. I first met her on 04/22/2014 at the request of her cardiologist, at which time she reported snoring, morning headaches and palpitations, as well as frequent nighttime awakenings. She had a history of PVCs and bigeminy. I suggested we proceed with sleep study testing. Unfortunately, her insurance denied and attended sleep study, I ordered a home sleep test. Patient did not have it at the time.  Today, 05/07/2016: She reports that she did not pursue A home sleep test 2 years ago but would be willing to pursue it if needed. She would prefer an actual sleep study. Nevertheless, she reports daytime somnolence, her Epworth sleepiness score is 14 out of 24, fatigue score is 32 out of 63. She tries to exercise 5 days a week. Bedtime is around 11:30 and wake up time on Mondays, Wednesdays and Fridays is 5:30 because she exercises in the mornings and on Tuesdays and Thursdays she can sleep till 6:30. She tries to drink enough water. She has been taking some supplements including magnesium and 2 different kinds of multivitamin as part of a weight loss program. She lost about 15 pounds some 6 months ago. On weekends she tries to sleep a little bit more and feels a little better. She lives alone. She has a boyfriend who does endorse her snoring. She has no children. She works full-time in the travel business. She does wake up with a sense of gasping for air at night, has had palpitations. Working on weight loss, works with trainer 3 times a week. She has occasional morning headaches. She denies  nocturia.  Previously:  04/22/2014: She reports recent palpitations, frequent nighttime awakenings, snoring and morning headaches. She recently went to UC with palpitations and was found to have frequent PVCs, and bigemini. She has had 2 episodes several months ago, lasting for minutes, which resolved on their own. For cardiac workup she had a stress test and an echocardiogram, both negative. She is working on weight loss in the last year and is working with a Clinical research associate. She has a history of migraines, but much better post menopause. She had stress at work and in August had several morning headaches.  She drinks coffee, 2 cups in the morning and 1 in the afternoon, no sodas. She is a never-smoker, and drinks alcohol occasionally and works FT in the travel business.    Her typical bedtime is reported to be around 11:30 PM and usual wake time is around 5:30 AM. Sleep onset typically occurs within a few minutes. She reports feeling marginally rested upon awakening. She wakes up on an average 2 times in the middle of the night (often because of her cat in the bed), and has to go to the bathroom 0 to 1 times on a typical night. She usually sleeps alone and with her BF occasionally.   She reports excessive daytime somnolence (EDS) and Her Epworth Sleepiness Score (ESS) is 15/24 today. She has fallen asleep while at the wheel, even when driving for about 90 minutes. She will use extra coffee or a 5 hour energy drink. The patient has not been taking a  scheduled nap, but has napped off and on.  She has been known to snore for the past many years. Snoring is reportedly mild, and not clearly associated with choking sounds and witnessed apneas, but mostly she sleeps alone. The patient admits to a sense of choking or strangling feeling when she wakes up in the mornings. There is no report of nighttime reflux, with occasional nighttime cough experienced. The patient has not noted any RLS symptoms and is not known to kick  while asleep or before falling asleep. There is no family history of RLS or OSA.   She denies cataplexy, sleep paralysis, hypnagogic or hypnopompic hallucinations, or sleep attacks. She does not report any vivid dreams, nightmares, dream enactments, or parasomnias, such as sleep talking or sleep walking, but has a history in the past of sleep talking. The patient has not had a sleep study or a home sleep test. Her bedroom is usually dark and cool. There is a TV in the bedroom and usually it is not on at night.    Her Past Medical History Is Significant For: Past Medical History:  Diagnosis Date  . Arthritis    FOOT  . Endometrial polyp   . Migraine   . PVC (premature ventricular contraction)   . Seasonal allergies   . Wears contact lenses     Her Past Surgical History Is Significant For: Past Surgical History:  Procedure Laterality Date  . DILATATION & CURRETTAGE/HYSTEROSCOPY WITH RESECTOCOPE N/A 12/24/2013   Procedure: DILATATION & CURETTAGE/HYSTEROSCOPY WITH RESECTOCOPE;  Surgeon: Darlyn Chamber, MD;  Location: [facility];  Service: Gynecology;  Laterality: N/A;  . HYSTEROSCOPY  12/2013    Her Family History Is Significant For: Family History  Problem Relation Age of Onset  . Migraines Father   . Asthma Paternal Grandmother     Her Social History Is Significant For: Social History   Social History  . Marital status: Single    Spouse name: N/A  . Number of children: 0  . Years of education: BS   Occupational History  . [employer] MANAGEMENT  [employer]    Social History Main Topics  . Smoking status: Never Smoker  . Smokeless tobacco: Never Used  . Alcohol use 1.0 oz/week    2 drink(s) per week     Comment: OCCASIONAL,1-3 drinks weekly  . Drug use: No  . Sexual activity: Not Asked   Other Topics Concern  . None   Social History Narrative   Patient is right handed and resides alone,consumes 2-3 caffeinated beverages daily    Her Allergies Are:   No Known Allergies:   Her Current Medications Are:  Outpatient Encounter Prescriptions as of 05/07/2016  Medication Sig  . [DISCONTINUED] cetirizine (ZYRTEC) 10 MG tablet Take 10 mg by mouth daily.    . [DISCONTINUED] ibuprofen (ADVIL,MOTRIN) 200 MG tablet Take 200 mg by mouth every 6 (six) hours as needed.   No facility-administered encounter medications on file as of 05/07/2016.   :  Review of Systems:  Out of a complete 14 point review of systems, all are reviewed and negative with the exception of these symptoms as listed below: Review of Systems  Neurological:       Patient was scheduled to do sleep study about 2 years ago, never went through with it due to insurance would not pay for in lab sleep study.   H/O PVCs, snoring, patient has woken with sensation that she needs to take a deep breath, morning headaches,  wakes up feeling tired, daytime tiredness, denies taking naps.   Epworth Sleepiness Scale 0= would never doze 1= slight chance of dozing 2= moderate chance of dozing 3= high chance of dozing  Sitting and reading:3 Watching TV:3 Sitting inactive in a public place (ex. Theater or meeting):1 As a passenger in a car for an hour without a break:1 Lying down to rest in the afternoon:3 Sitting and talking to someone:0 Sitting quietly after lunch (no alcohol):2 In a car, while stopped in traffic:0 Total:14   Objective:  Neurologic Exam  Physical Exam Physical Examination:   Vitals:   05/07/16 0917  BP: 110/68  Pulse: 78  Resp: 16    General Examination: The patient is a very pleasant 55 y.o. female in no acute distress. She appears well-developed and well-nourished and well groomed.   HEENT: Normocephalic, atraumatic, pupils are equal, round and reactive to light and accommodation. Funduscopic exam is normal with sharp disc margins noted. Extraocular tracking is good without limitation to gaze excursion or nystagmus noted. Normal smooth pursuit is noted.  Hearing is grossly intact. Face is symmetric with normal facial animation and normal facial sensation. Speech is clear with no dysarthria noted. There is no hypophonia. There is no lip, neck/head, jaw or voice tremor. Neck is supple with full range of passive and active motion. There are no carotid bruits on auscultation. Oropharynx exam reveals: mild mouth dryness, good dental hygiene and mild airway crowding, due to longer tongue and redundant soft palate. Mallampati is class I. Tongue protrudes centrally and palate elevates symmetrically. Tonsils are 1+ in size. Neck size is 15 inches. She has a tiny overbite. Nasal inspection reveals no significant nasal mucosal bogginess or redness and no septal deviation.   Chest: Clear to auscultation without wheezing, rhonchi or crackles noted.  Heart: S1+S2+0, regular and normal without murmurs, rubs or gallops noted.   Abdomen: Soft, non-tender and non-distended with normal bowel sounds appreciated on auscultation.  Extremities: There is no pitting edema in the distal lower extremities bilaterally. Pedal pulses are intact.  Skin: Warm and dry without trophic changes noted. There are very mild varicose veins.  Musculoskeletal: exam reveals no obvious joint deformities, tenderness or joint swelling or erythema.   Neurologically:  Mental status: The patient is awake, alert and oriented in all 4 spheres. Her immediate and remote memory, attention, language skills and fund of knowledge are appropriate. There is no evidence of aphasia, agnosia, apraxia or anomia. Speech is clear with normal prosody and enunciation. Thought process is linear. Mood is normal and affect is normal.  Cranial nerves II - XII are as described above under HEENT exam. In addition: shoulder shrug is normal with equal shoulder height noted. Motor exam: Normal bulk, strength and tone is noted. There is no drift, tremor or rebound. Romberg is negative. Reflexes are 2+ throughout. Babinski:  Toes are flexor bilaterally. Fine motor skills and coordination: intact with normal finger taps, normal hand movements, normal rapid alternating patting, normal foot taps and normal foot agility.  Cerebellar testing: No dysmetria or intention tremor on finger to nose testing. Heel to shin is unremarkable bilaterally. There is no truncal or gait ataxia.  Sensory exam: intact to light touch in the UEs and LEs.  Gait, station and balance: She stands easily. No veering to one side is noted. No leaning to one side is noted. Posture is age-appropriate and stance is narrow based. Gait shows normal stride length and normal pace. No problems turning are noted. She turns  en bloc. Tandem walk is unremarkable.             Assessment and Plan:   In summary, Crystal Barber is a very pleasant 55 year old female with an underlying medical history allergies, asthma, obesity, and palpitations with abnormal EKG, whose history and physical exam are concerning for obstructive sleep apnea (OSA), due to her report of significant daytime somnolence, morning headaches, palpitations and PVCs seen including bigeminy on EKG.  I had a long chat with the patient about my findings and the diagnosis of OSA, its prognosis and treatment options. We talked about medical treatments, surgical interventions and non-pharmacological approaches. I explained in particular the risks and ramifications of untreated moderate to severe OSA, especially with respect to developing cardiovascular disease down the Road, including congestive heart failure, difficult to treat hypertension, cardiac arrhythmias, or stroke. Even type 2 diabetes has, in part, been linked to untreated OSA. Symptoms of untreated OSA include daytime sleepiness, memory problems, mood irritability and mood disorder such as depression and anxiety, lack of energy, as well as recurrent headaches, especially morning headaches. We talked about trying to maintain a healthy lifestyle in  general, as well as the importance of weight control. I encouraged the patient to eat healthy, exercise daily and keep well hydrated, to keep a scheduled bedtime and wake time routine, to not skip any meals and eat healthy snacks in between meals. I advised the patient not to drive when feeling sleepy. I recommended the following at this time: sleep study with potential positive airway pressure titration. (We will score hypopneas at 4% and split the sleep study into diagnostic and treatment portion, if the estimated. 2 hour AHI is >20/h).   I explained the sleep test procedure to the patient and also outlined possible surgical and non-surgical treatment options of OSA, including the use of a custom-made dental device (which would require a referral to a specialist dentist or oral surgeon), upper airway surgical options, such as pillar implants, radiofrequency surgery, tongue base surgery, and UPPP (which would involve a referral to an ENT surgeon). Rarely, jaw surgery such as mandibular advancement may be considered.  I also explained the CPAP treatment option to the patient, who indicated that she would be willing to try CPAP if the need arises. I explained the importance of being compliant with PAP treatment, not only for insurance purposes but primarily to improve Her symptoms, and for the patient's long term health benefit, including to reduce Her cardiovascular risks. I answered all her questions today and the patient was in agreement. I would like to see her back after the sleep study is completed and encouraged her to call with any interim questions, concerns, problems or updates.

## 2016-05-17 ENCOUNTER — Telehealth: Payer: Self-pay | Admitting: Neurology

## 2016-10-04 NOTE — Telephone Encounter (Signed)
Error

## 2016-11-12 ENCOUNTER — Telehealth: Payer: Self-pay

## 2016-11-12 NOTE — Telephone Encounter (Signed)
Patient will call back to schedule sleep study. Have not heard back.

## 2017-12-21 ENCOUNTER — Other Ambulatory Visit: Payer: Self-pay

## 2017-12-21 ENCOUNTER — Encounter (HOSPITAL_COMMUNITY): Payer: Self-pay | Admitting: Emergency Medicine

## 2017-12-21 ENCOUNTER — Emergency Department (HOSPITAL_COMMUNITY)
Admission: EM | Admit: 2017-12-21 | Discharge: 2017-12-22 | Disposition: A | Discharge status: Home / Self Care | Payer: Commercial Managed Care - PPO | Attending: Emergency Medicine | Admitting: Emergency Medicine

## 2017-12-21 DIAGNOSIS — W5501XA Bitten by cat, initial encounter: Secondary | ICD-10-CM | POA: Diagnosis not present

## 2017-12-21 DIAGNOSIS — Y939 Activity, unspecified: Secondary | ICD-10-CM | POA: Insufficient documentation

## 2017-12-21 DIAGNOSIS — Y929 Unspecified place or not applicable: Secondary | ICD-10-CM | POA: Insufficient documentation

## 2017-12-21 DIAGNOSIS — Y999 Unspecified external cause status: Secondary | ICD-10-CM | POA: Insufficient documentation

## 2017-12-21 DIAGNOSIS — S51851A Open bite of right forearm, initial encounter: Secondary | ICD-10-CM | POA: Diagnosis present

## 2017-12-21 DIAGNOSIS — Z79899 Other long term (current) drug therapy: Secondary | ICD-10-CM | POA: Insufficient documentation

## 2017-12-21 DIAGNOSIS — Z23 Encounter for immunization: Secondary | ICD-10-CM | POA: Insufficient documentation

## 2017-12-21 MED ORDER — AMOXICILLIN-POT CLAVULANATE 875-125 MG PO TABS: 1.0000 | ORAL_TABLET | Freq: Two times a day (BID) | ORAL | 0 refills | Status: DC

## 2017-12-21 MED ORDER — AMOXICILLIN-POT CLAVULANATE 875-125 MG PO TABS
1.0000 | ORAL_TABLET | Freq: Once | ORAL | Status: AC
  Administered 2017-12-21: 1 via ORAL
  Filled 2017-12-21: qty 1

## 2017-12-21 MED ORDER — TETANUS-DIPHTH-ACELL PERTUSSIS 5-2.5-18.5 LF-MCG/0.5 IM SUSP
0.5000 mL | Freq: Once | INTRAMUSCULAR | Status: AC
  Administered 2017-12-21: 0.5 mL via INTRAMUSCULAR
  Filled 2017-12-21: qty 0.5

## 2017-12-21 NOTE — ED Triage Notes (Signed)
Pt was cat sitting for her neighbor and got bitten on her right forearm.  States it was a "deep bite" that she had to remove the cat as its teeth were embedded and wouldn't release. Up to date on shots.  Pt does not recall when her last tetanus shot was.

## 2017-12-21 NOTE — ED Provider Notes (Signed)
[facility] EMERGENCY DEPARTMENT Provider Note   CSN: [id number] Arrival date & time: 12/21/17  2056     History   Chief Complaint Chief Complaint  Patient presents with  . Animal Bite    HPI Crystal Barber is a 57 y.o. female.  HPI   57 year old female presenting for evaluation of a recent cat bite.  Patient report she is currently cat sitting for her neighbor who is [place] the [place].  States that the cat was friendly and patient picked cat up to pet went it bit her on her right forearm.  She mentioned that the bite was deep requiring patient to remove the cat by the mouth as the teeth were embedded in her forearm.  Initially report sharp throbbing pain moderate in severity which has since improved.  Incident happened approximately 5 hours ago.  She is not up-to-date with her tetanus.  She believes the cat is up-to-date with all immunization but will confirm with owner.  This is not an unprovoked bite.  No report of fever or chills.  No other complaint.  Past Medical History:  Diagnosis Date  . Arthritis    FOOT  . Endometrial polyp   . Migraine   . PVC (premature ventricular contraction)   . Seasonal allergies   . Wears contact lenses     Patient Active Problem List   Diagnosis Date Noted  . Endometrial polyp 12/24/2013    Class: Hospitalized for  . Submucous uterine fibroid 12/24/2013    Class: Present on Admission  . Status post hysteroscopic myomectomy 12/24/2013    Class: Status post  . Status post hysteroscopic polypectomy 12/24/2013    Class: Status post  . Dyspnea 06/06/2011  . METATARSALGIA 04/27/2009  . PLANTAR FASCIITIS, BILATERAL 04/27/2009    Past Surgical History:  Procedure Laterality Date  . DILATATION & CURRETTAGE/HYSTEROSCOPY WITH RESECTOCOPE N/A 12/24/2013   Procedure: DILATATION & CURETTAGE/HYSTEROSCOPY WITH RESECTOCOPE;  Surgeon: Darlyn Chamber, MD;  Location: [facility];  Service: Gynecology;  Laterality:  N/A;  . HYSTEROSCOPY  12/2013     OB History   None      Home Medications    Prior to Admission medications   Medication Sig Start Date End Date Taking? Authorizing Provider  MAGNESIUM PO Take by mouth.    [provider]  Multiple Vitamin (MULTIVITAMIN) capsule Take 1 capsule by mouth daily.    [provider]  NON FORMULARY Tumeric    [provider]  Probiotic Product (PROBIOTIC DAILY PO) Take by mouth.    [provider]    Family History Family History  Problem Relation Age of Onset  . Migraines Father   . Asthma Paternal Grandmother     Social History Social History   Tobacco Use  . Smoking status: Never Smoker  . Smokeless tobacco: Never Used  Substance Use Topics  . Alcohol use: Yes    Alcohol/week: 1.0 oz    Types: 2 drink(s) per week    Comment: OCCASIONAL,1-3 drinks weekly  . Drug use: No     Allergies   Patient has no known allergies.   Review of Systems Review of Systems  Constitutional: Negative for fever.  Skin: Positive for wound.  Neurological: Negative for numbness.     Physical Exam Updated Vital Signs BP (!) 126/101   Pulse 87   Temp 98 F (36.7 C) (Oral)   SpO2 100%   Physical Exam  Constitutional: She appears well-developed and  well-nourished. No distress.  HENT:  Head: Atraumatic.  Eyes: Conjunctivae are normal.  Neck: Neck supple.  Neurological: She is alert.  Skin: No rash noted.  Right forearm: 3 small puncture wound noted to mid dorsal aspect of the forearm, mildly tender to palpation, warm compartment is soft, no foreign body noted, no lymphangitis.  Psychiatric: She has a normal mood and affect.  Nursing note and vitals reviewed.    ED Treatments / Results  Labs (all labs ordered are listed, but only abnormal results are displayed) Labs Reviewed - No data to display  EKG None  Radiology No results found.  Procedures Procedures (including critical care  time)  Medications Ordered in ED Medications  Tdap (BOOSTRIX) injection 0.5 mL (has no administration in time range)  amoxicillin-clavulanate (AUGMENTIN) 875-125 MG per tablet 1 tablet (has no administration in time range)     Initial Impression / Assessment and Plan / ED Course  I have reviewed the triage vital signs and the nursing notes.  Pertinent labs & imaging results that were available during my care of the patient were reviewed by me and considered in my medical decision making (see chart for details).     BP (!) 126/101   Pulse 87   Temp 98 F (36.7 C) (Oral)   SpO2 100%    Final Clinical Impressions(s) / ED Diagnoses   Final diagnoses:  Cat bite of right forearm, initial encounter    ED Discharge Orders        Ordered    amoxicillin-clavulanate (AUGMENTIN) 875-125 MG tablet  2 times daily     12/21/17 2336     11:34 PM Patient with cat bite to right forearm she has 3 small puncture wound.  She will confirm the immunization status of the cat however it is likely that the cat has been immunized.  Patient will benefit from Augmentin to treat for potential bite infection.  Will update tetanus.  Patient made aware of signs and symptoms to return if the wound become infected.   Domenic Moras, PA-C 12/21/17 2337    Ripley Fraise, MD 12/22/17 [phone]

## 2018-02-10 ENCOUNTER — Other Ambulatory Visit: Payer: Self-pay | Admitting: Obstetrics and Gynecology

## 2018-02-10 DIAGNOSIS — R928 Other abnormal and inconclusive findings on diagnostic imaging of breast: Secondary | ICD-10-CM

## 2018-02-12 ENCOUNTER — Ambulatory Visit
Admission: RE | Admit: 2018-02-12 | Discharge: 2018-02-12 | Disposition: A | Discharge status: Home / Self Care | Payer: Commercial Managed Care - PPO | Source: Ambulatory Visit | Attending: Obstetrics and Gynecology | Admitting: Obstetrics and Gynecology

## 2018-02-12 DIAGNOSIS — R928 Other abnormal and inconclusive findings on diagnostic imaging of breast: Secondary | ICD-10-CM

## 2018-02-12 IMAGING — MG DIGITAL DIAGNOSTIC UNILATERAL RIGHT MAMMOGRAM WITH TOMO AND CAD
6 series · 6 of 18 positions shown · non-contrast
Comparison: Previous exam(s).

CLINICAL DATA: Screening recall for possible architectural
distortion in the upper outer right breast.

EXAM:
DIGITAL DIAGNOSTIC RIGHT MAMMOGRAM WITH CAD AND TOMO
ULTRASOUND RIGHT BREAST

[R CC synth-2D]
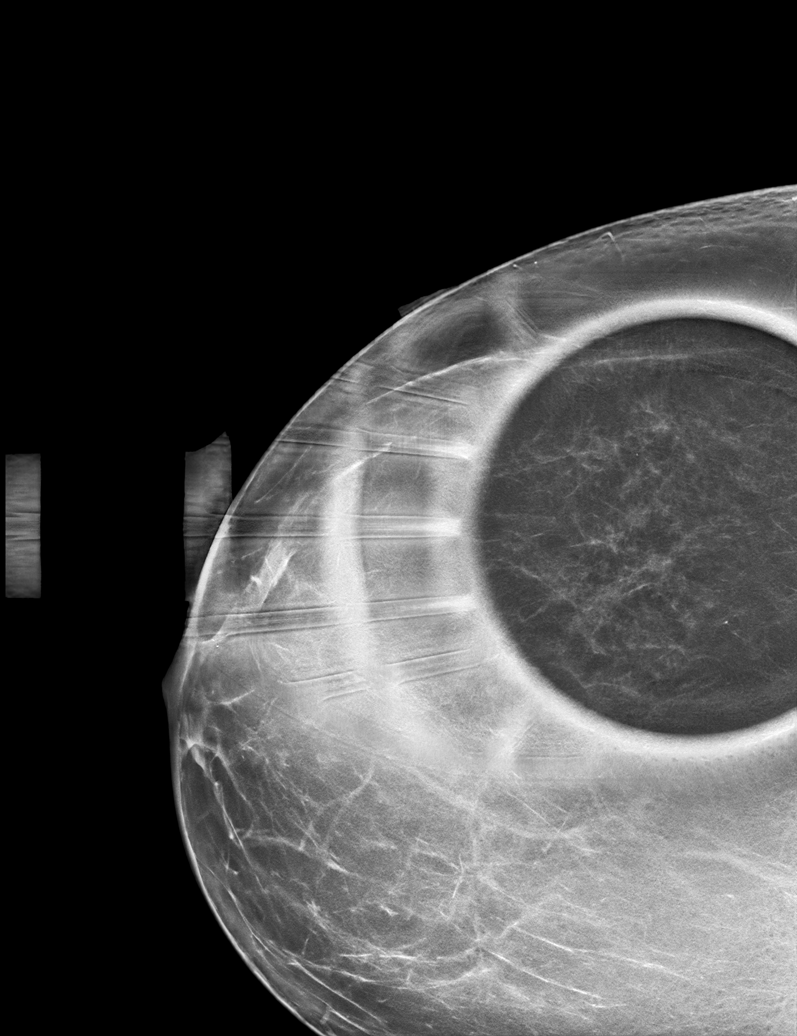

[R MLO synth-2D (1 of 2)]
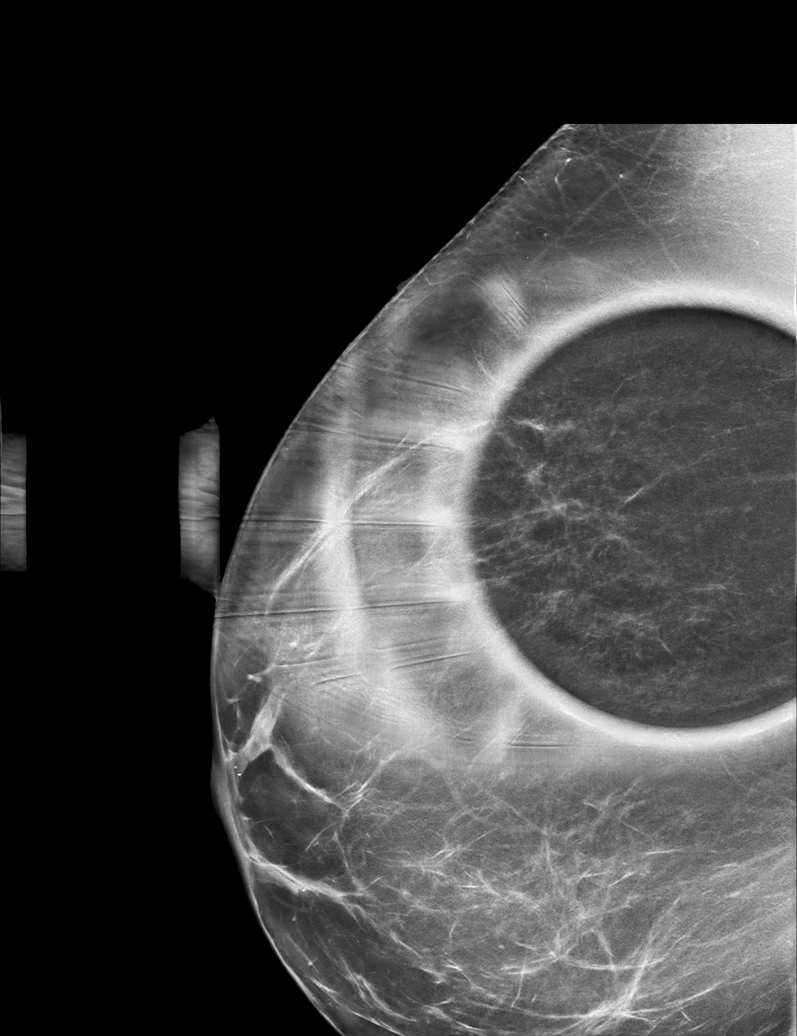

[R MLO synth-2D (2 of 2)]
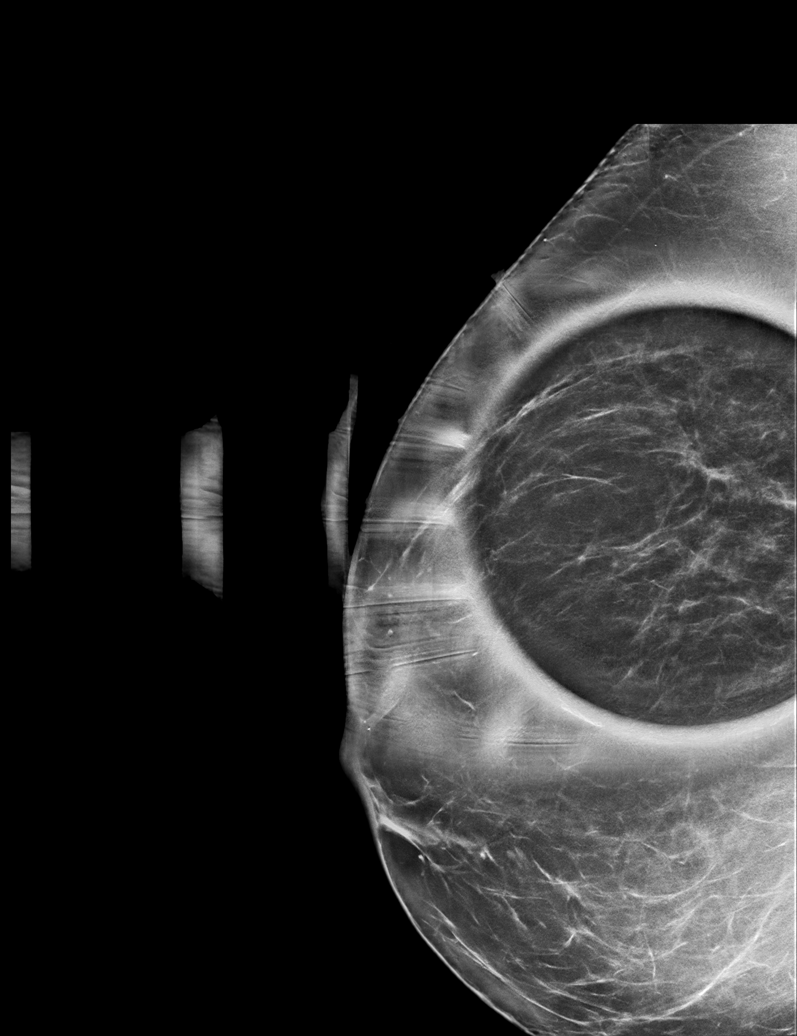

[R MLO tomo (1 of 2) · tomo slice 40/79.0]
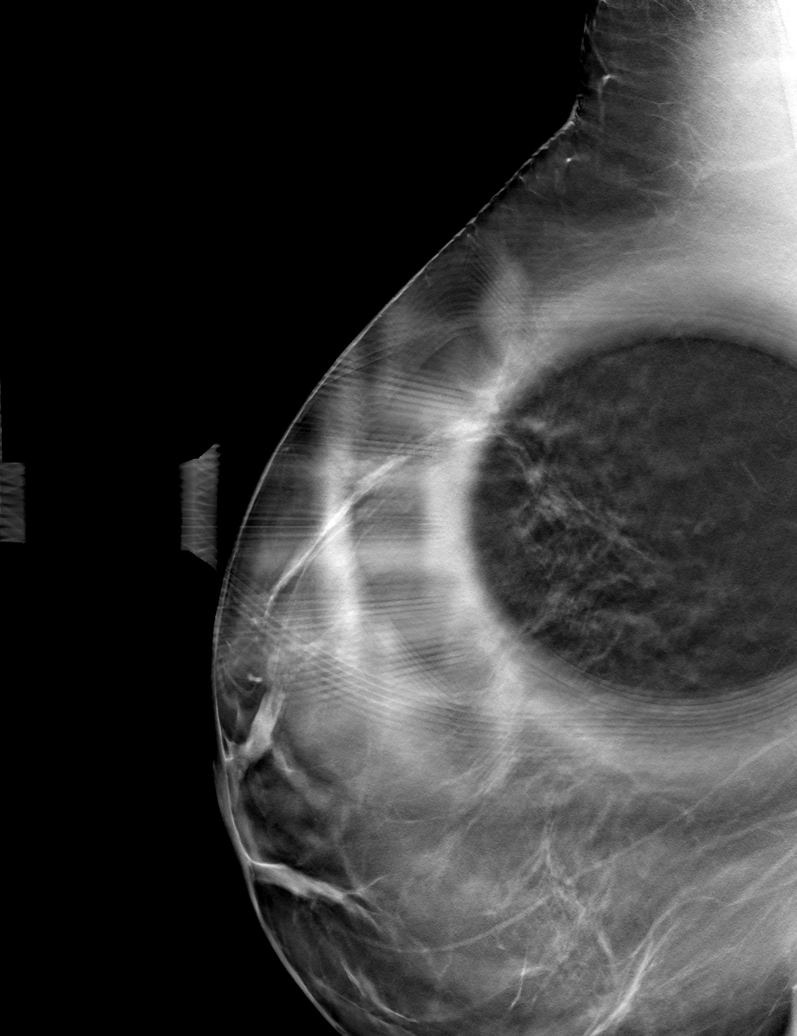

[R CC tomo · tomo slice 38/75.0]
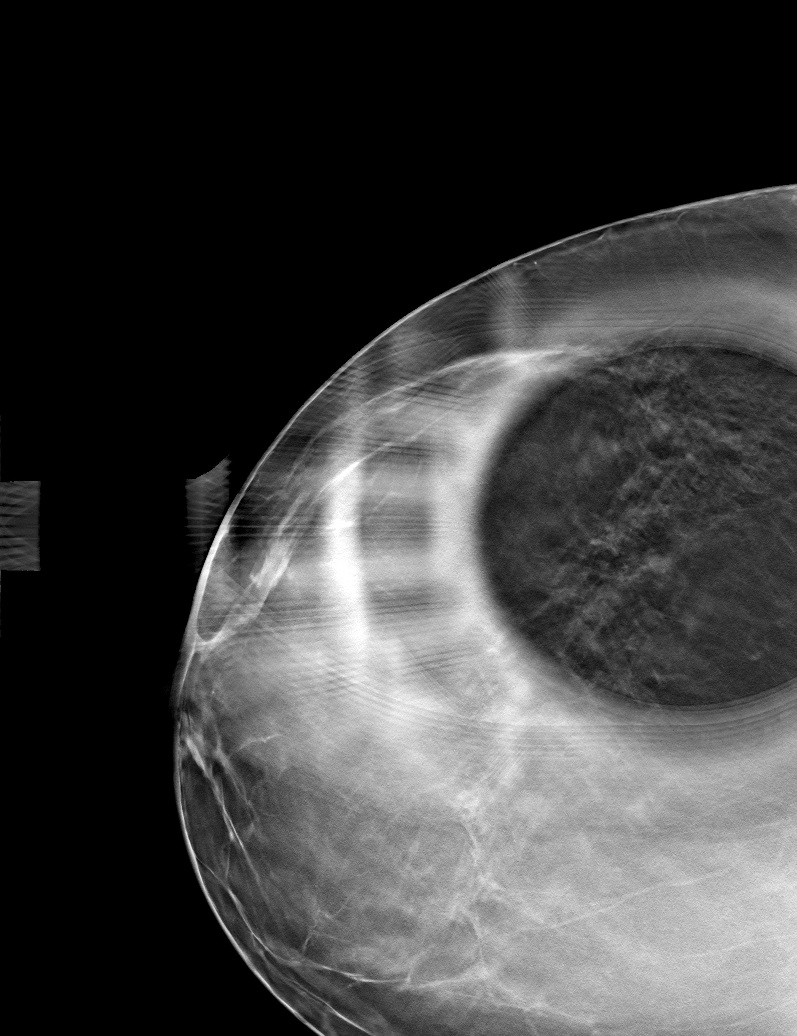

[R MLO tomo (2 of 2) · tomo slice 39/77.0]
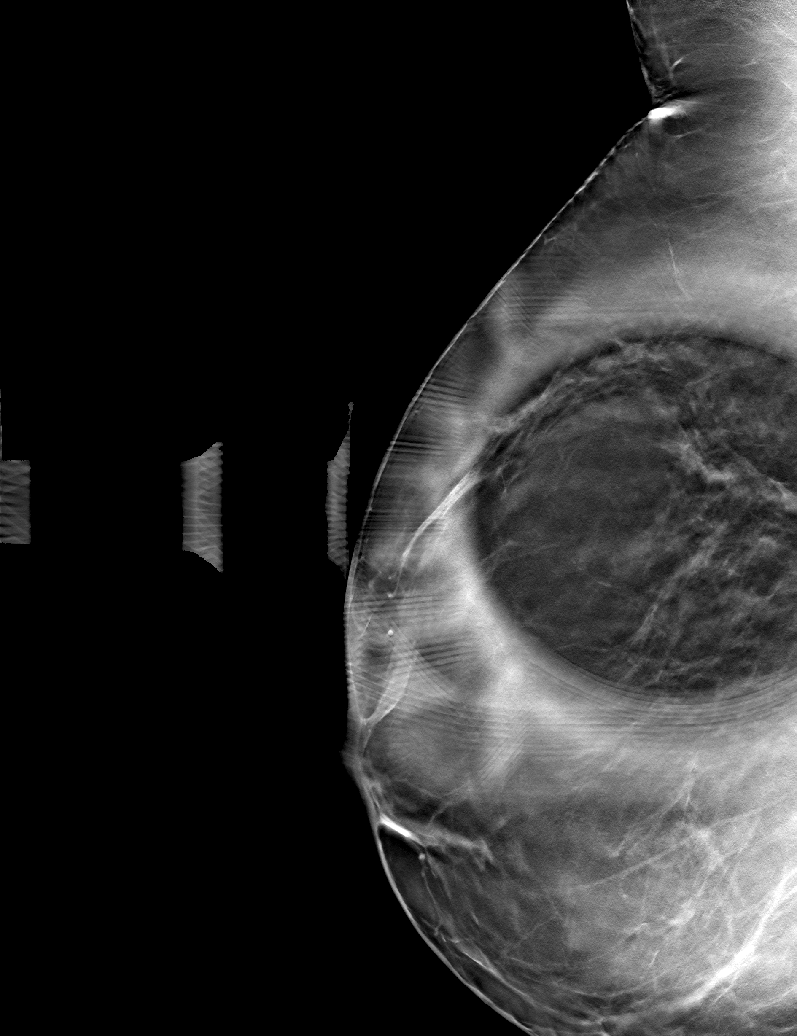

[6 of 18 positions shown; findings below may reference images not displayed]

ACR Breast Density Category b: There are scattered areas of
fibroglandular density.
FINDINGS: Possible distortion in the upper outer right breast disperses with
spot compression imaging. There is no underlying true distortion.
There are no discrete masses. There are no suspicious
calcifications.

Mammographic images were processed with CAD.

On physical exam, no mass is palpated in the upper outer right
breast.

Targeted ultrasound is performed, showing normal tissue throughout
the upper outer right breast. No mass or sonographic distortion.
IMPRESSION: Negative exam.  No evidence of breast malignancy.

RECOMMENDATION:
Screening mammogram in one year.(Code:[R0])

I have discussed the findings and recommendations with the patient.
Results were also provided in writing at the conclusion of the
visit. If applicable, a reminder letter will be sent to the patient
regarding the next appointment.

BI-RADS CATEGORY  1: Negative.

## 2018-06-16 ENCOUNTER — Other Ambulatory Visit: Payer: Self-pay

## 2018-06-16 DIAGNOSIS — M7989 Other specified soft tissue disorders: Secondary | ICD-10-CM

## 2018-08-05 ENCOUNTER — Encounter: Payer: Commercial Managed Care - PPO | Admitting: Vascular Surgery

## 2018-08-05 ENCOUNTER — Inpatient Hospital Stay (HOSPITAL_COMMUNITY): Admission: RE | Admit: 2018-08-05 | Payer: Commercial Managed Care - PPO | Source: Ambulatory Visit

## 2018-08-07 ENCOUNTER — Encounter: Payer: Self-pay | Admitting: Vascular Surgery

## 2019-04-06 ENCOUNTER — Other Ambulatory Visit: Payer: Self-pay

## 2019-04-06 DIAGNOSIS — Z20822 Contact with and (suspected) exposure to covid-19: Secondary | ICD-10-CM

## 2019-04-08 LAB — NOVEL CORONAVIRUS, NAA: SARS-CoV-2, NAA: NOT DETECTED

## 2019-04-30 ENCOUNTER — Ambulatory Visit: Payer: Commercial Managed Care - PPO | Admitting: Sports Medicine

## 2019-05-12 ENCOUNTER — Other Ambulatory Visit: Payer: Self-pay | Admitting: Obstetrics and Gynecology

## 2019-05-12 DIAGNOSIS — Z803 Family history of malignant neoplasm of breast: Secondary | ICD-10-CM

## 2019-05-19 ENCOUNTER — Ambulatory Visit: Payer: Commercial Managed Care - PPO | Admitting: Sports Medicine

## 2019-06-01 ENCOUNTER — Other Ambulatory Visit: Payer: Self-pay

## 2019-06-01 ENCOUNTER — Ambulatory Visit
Admission: RE | Admit: 2019-06-01 | Discharge: 2019-06-01 | Disposition: A | Discharge status: Home / Self Care | Payer: No Typology Code available for payment source | Source: Ambulatory Visit | Attending: Obstetrics and Gynecology | Admitting: Obstetrics and Gynecology

## 2019-06-01 DIAGNOSIS — Z803 Family history of malignant neoplasm of breast: Secondary | ICD-10-CM

## 2019-06-01 IMAGING — MR MR BREAST WO/W CM  BILAT
5 series · 30 of 48 positions shown · IV contrast (gadavist)
Comparison: Screening mammogram [DATE] and earlier

CLINICAL DATA: Abbreviated Breast MRI for breast cancer screening.
Family history of breast cancer in the patient's 2 aunts. Lifetime
risk of 19.4%.

LABS:  Creatinine was obtained on site at [HOSPITAL] at [REDACTED] [HOSPITAL].
Results: Creatinine 1.0 mg/dL. GFR 61.
EXAM:
BILATERAL ABBREVIATED BREAST MRI WITH AND WITHOUT CONTRAST
TECHNIQUE: Multiplanar, multisequence MR images of both breasts were obtained
prior to and following the intravenous administration of ml of
Gadavist

[Series 2: t2_tirm_tra ipat (a-p) · axial · 3.0mm · 0.76mm/px · z∈[-61,+101]mm · 5 of 55 slices shown]
[im 1/55]
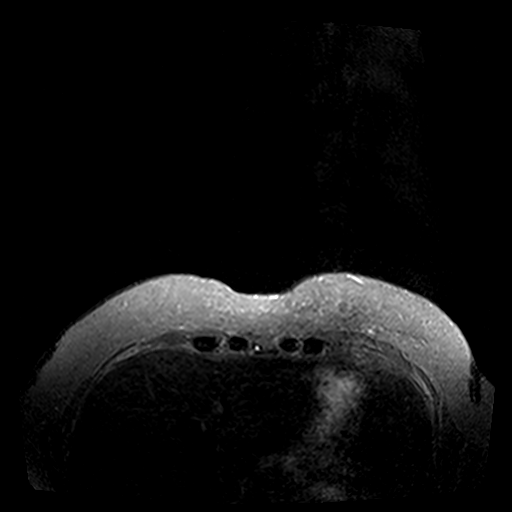
[im 14/55]
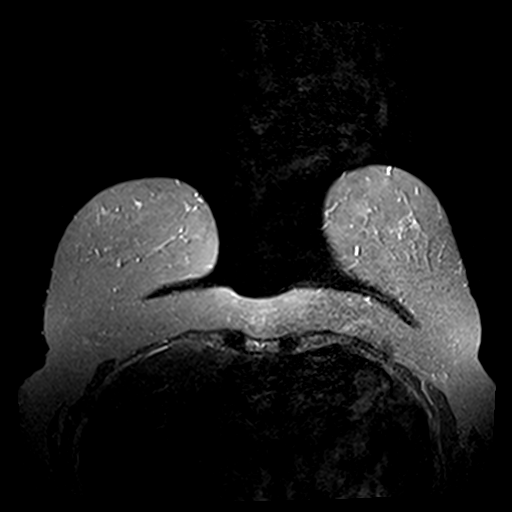
[im 28/55]
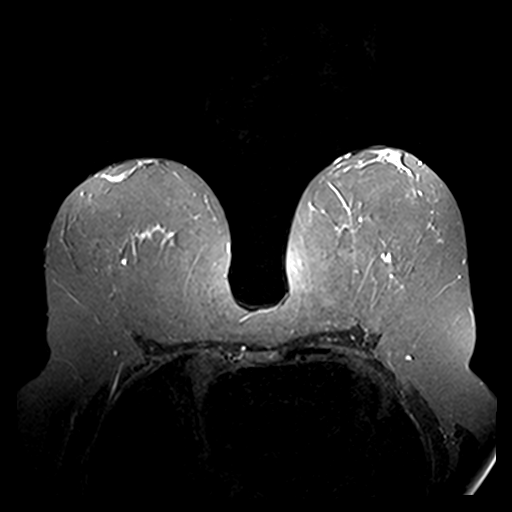
[im 41/55]
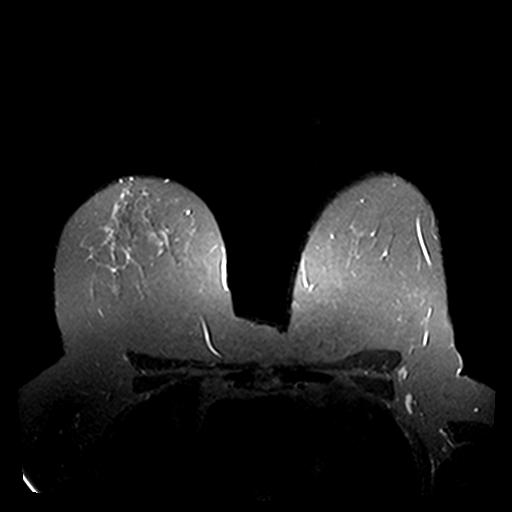
[im 55/55]
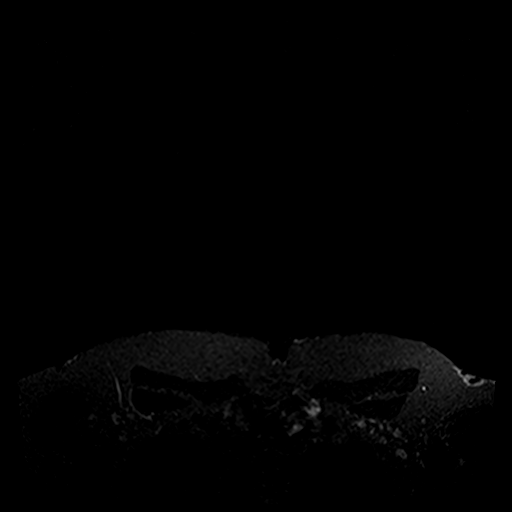

[Series 3: fl3d pre-cm · axial · non-contrast · 1.2mm · 1.02mm/px · z∈[-66,+106]mm · 8 of 144 slices shown]
[im 1/144]
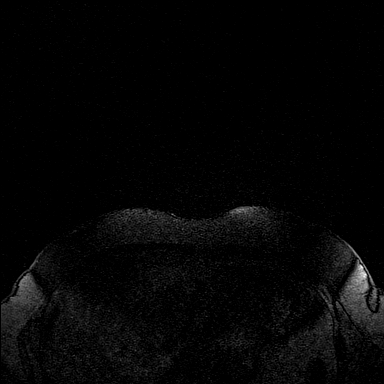
[im 23/144]
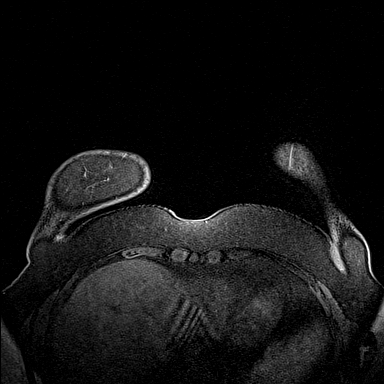
[im 45/144]
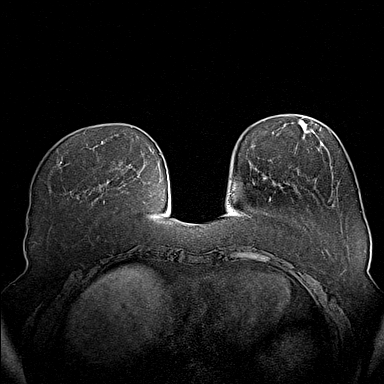
[im 67/144]
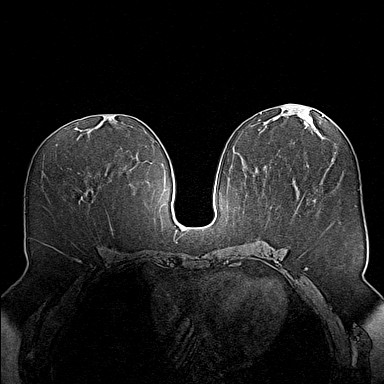
[im 78/144]
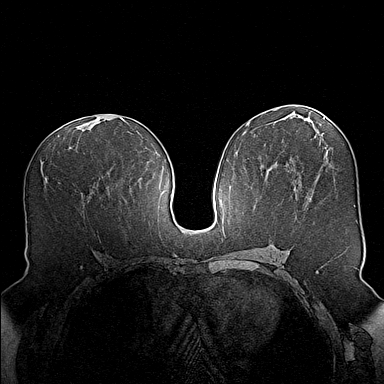
[im 100/144]
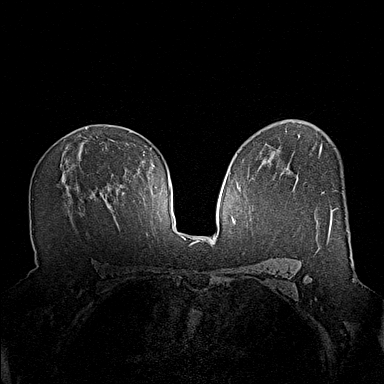
[im 122/144]
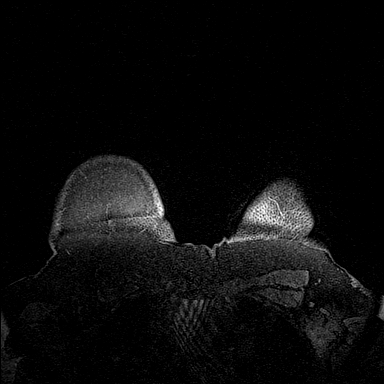
[im 144/144]
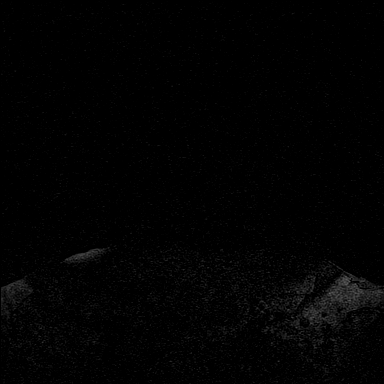

[Series 4: fl3d post-cm 20 · axial · 1.2mm · 1.02mm/px · z∈[-66,+106]mm · 8 of 144 slices shown (1 of 3)]
[im 1/144]
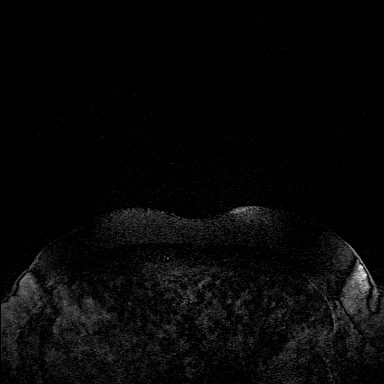
[im 23/144]
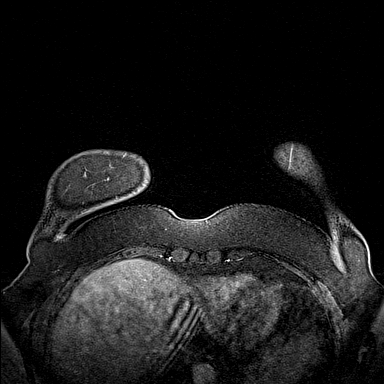
[im 45/144]
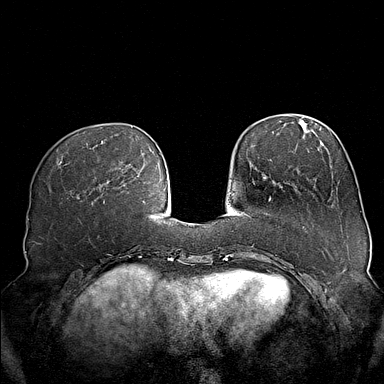
[im 67/144]
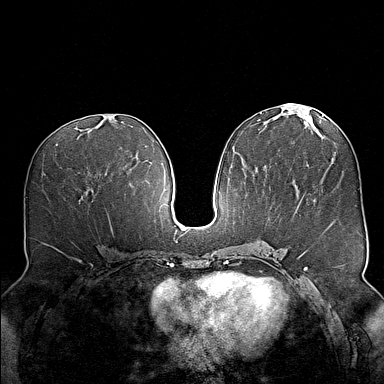
[im 78/144]
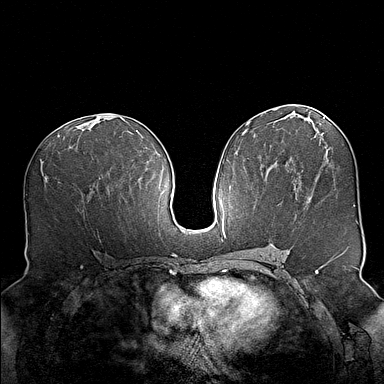
[im 100/144]
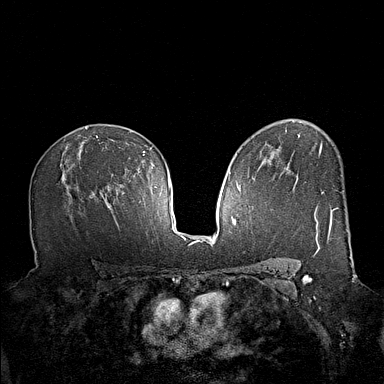
[im 122/144]
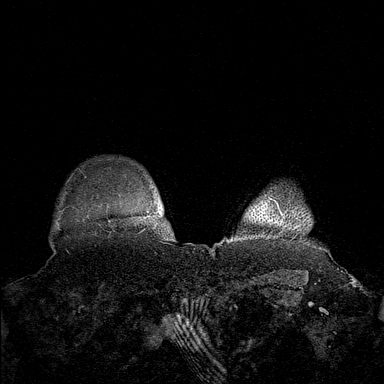
[im 144/144]
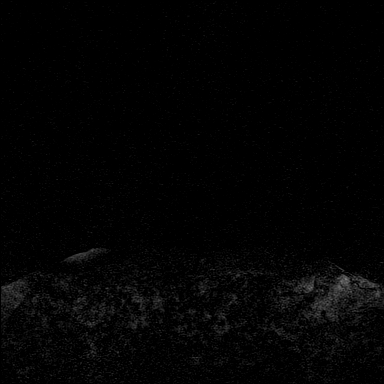

[Series 5: fl3d post-cm 20 · axial · 1.2mm · 1.02mm/px · z∈[-66,+106]mm · 8 of 144 slices shown (2 of 3)]
[im 1/144]
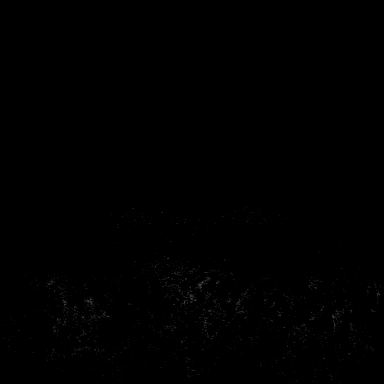
[im 23/144]
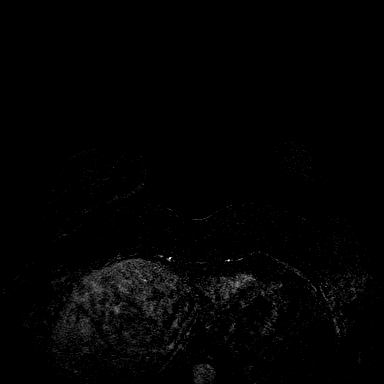
[im 45/144]
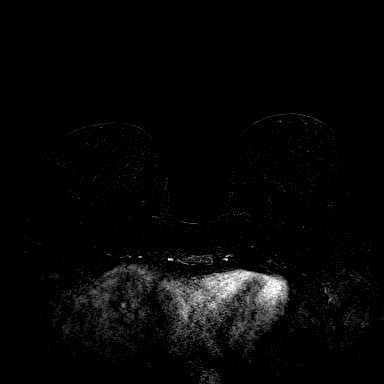
[im 67/144]
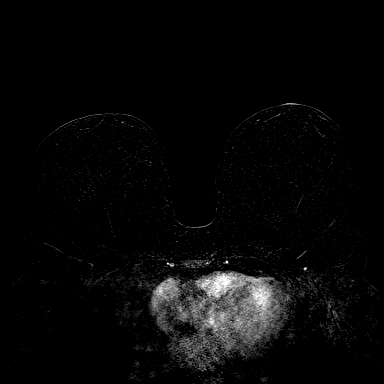
[im 78/144]
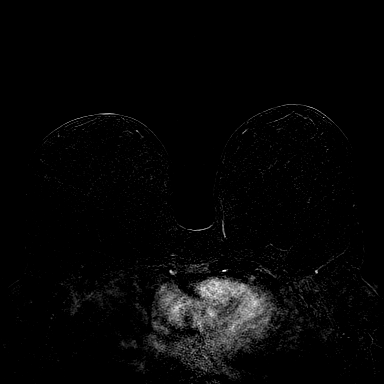
[im 100/144]
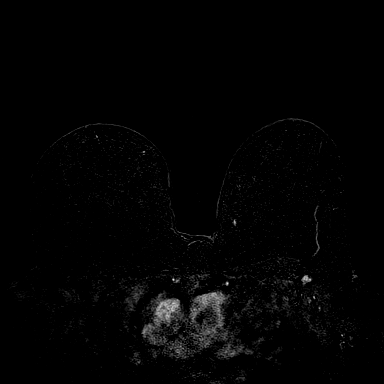
[im 122/144]
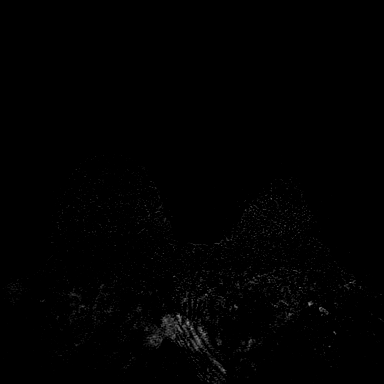
[im 144/144]
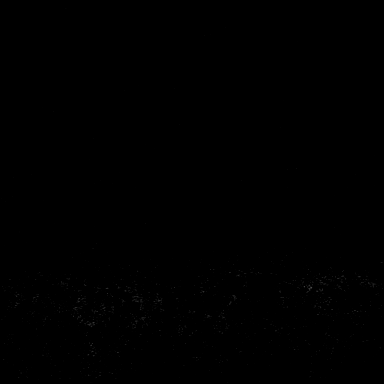

[Series 6: fl3d post-cm 20 · axial · 172.8mm · 1.02mm/px · 1 of 1 slices shown (3 of 3)]
[im 1/1]
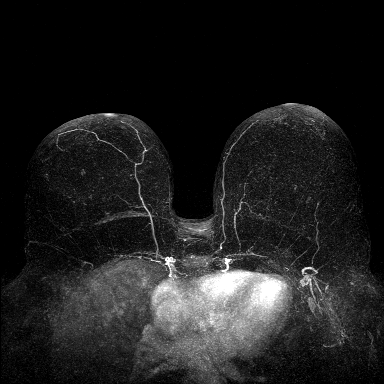

[30 of 48 positions shown; findings below may reference images not displayed]

Three-dimensional MR images were rendered by post-processing of the
original MR data on an independent workstation. The
three-dimensional MR images were interpreted, and findings are
reported in the following complete MRI report for this study. Three
dimensional images were evaluated at the independent [employer]
workstation
FINDINGS: Breast composition: b. Scattered fibroglandular tissue.

Background parenchymal enhancement: Minimal

Right breast: No mass or abnormal enhancement.

Left breast: No mass or abnormal enhancement.

Lymph nodes: No abnormal appearing lymph nodes.

Ancillary findings:  None.
IMPRESSION: No MRI evidence for malignancy.

RECOMMENDATION:
Recommend annual screening mammography. The patient is also eligible
for annual Abbreviated Breast MRI if she wishes

BI-RADS CATEGORY  1: Negative.

## 2019-06-01 MED ORDER — GADOBUTROL 1 MMOL/ML IV SOLN
10.0000 mL | Freq: Once | INTRAVENOUS | Status: AC | PRN
  Administered 2019-06-01: 10 mL via INTRAVENOUS

## 2019-06-02 ENCOUNTER — Ambulatory Visit: Payer: Commercial Managed Care - PPO | Admitting: Sports Medicine

## 2020-10-12 ENCOUNTER — Other Ambulatory Visit: Payer: Self-pay | Admitting: Obstetrics and Gynecology

## 2020-10-12 DIAGNOSIS — Z803 Family history of malignant neoplasm of breast: Secondary | ICD-10-CM

## 2020-11-08 ENCOUNTER — Ambulatory Visit: Payer: No Typology Code available for payment source

## 2020-11-26 ENCOUNTER — Ambulatory Visit
Admission: RE | Admit: 2020-11-26 | Discharge: 2020-11-26 | Disposition: A | Discharge status: Home / Self Care | Payer: No Typology Code available for payment source | Source: Ambulatory Visit | Attending: Obstetrics and Gynecology | Admitting: Obstetrics and Gynecology

## 2020-11-26 DIAGNOSIS — Z803 Family history of malignant neoplasm of breast: Secondary | ICD-10-CM

## 2020-11-26 IMAGING — MR MR BREAST WO/W CM  BILAT
4 of 6 series · 32 of 48 positions shown · IV contrast (gadavist)
Comparison: Previous exam(s).

CLINICAL DATA: Abbreviated Breast MRI for breast cancer screening.

LABS:  None.
EXAM:
BILATERAL ABBREVIATED BREAST MRI WITH AND WITHOUT CONTRAST
TECHNIQUE: Multiplanar, multisequence MR images of both breasts were obtained
prior to and following the intravenous administration of 10 ml of
Gadavist

[Series 2: t2_tirm_tra ipat (a-p) · axial · 3.0mm · 0.74mm/px · z∈[-74,+88]mm · 3 of 55 slices shown]
[im 1/55]
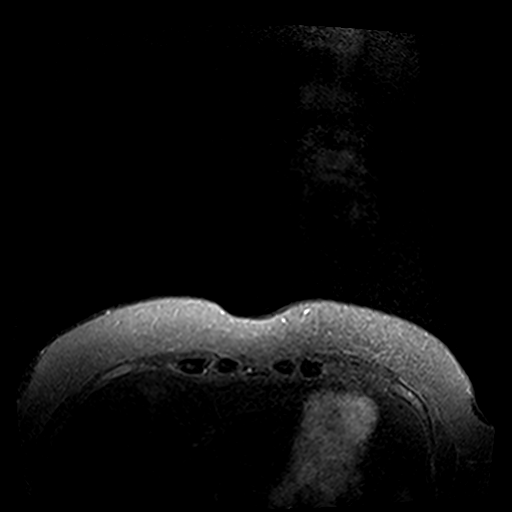
[im 28/55]
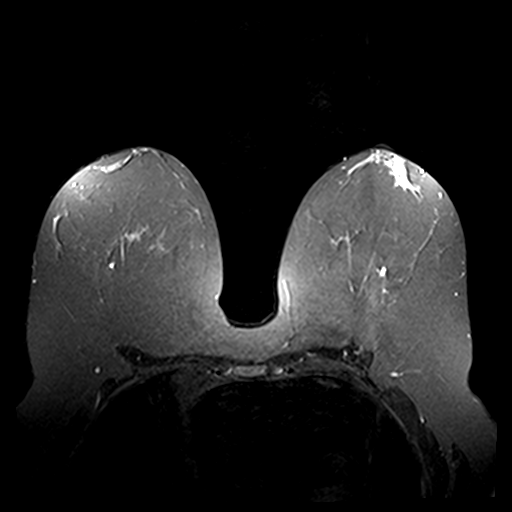
[im 55/55]
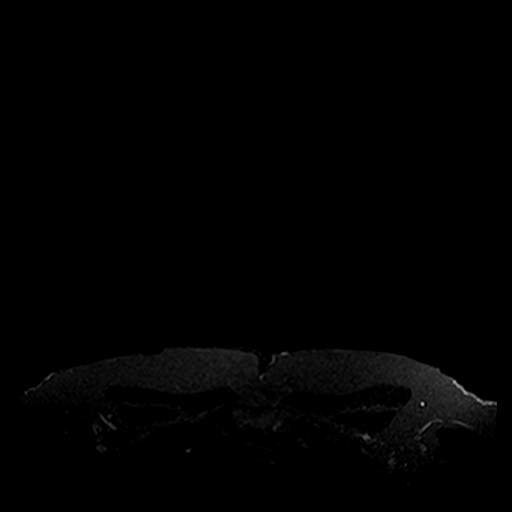

[Series 3: fl3d pre-cm no · axial · non-contrast · 1.2mm · 0.99mm/px · z∈[-75,+96]mm · 11 of 144 slices shown]
[im 1/144]
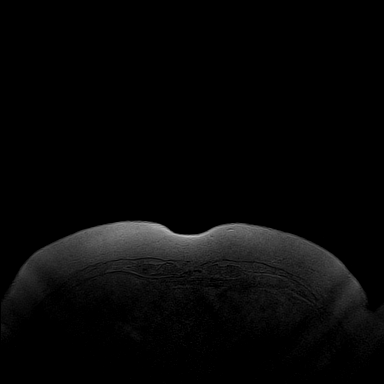
[im 15/144]
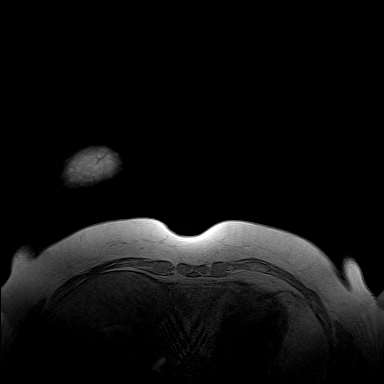
[im 29/144]
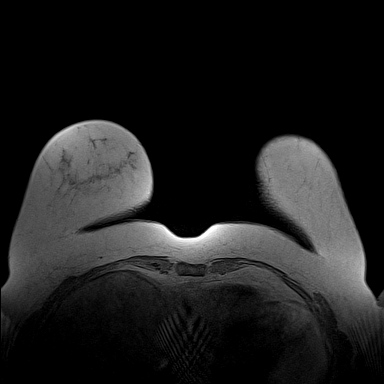
[im 43/144]
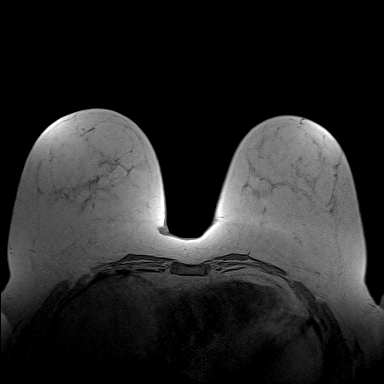
[im 58/144]
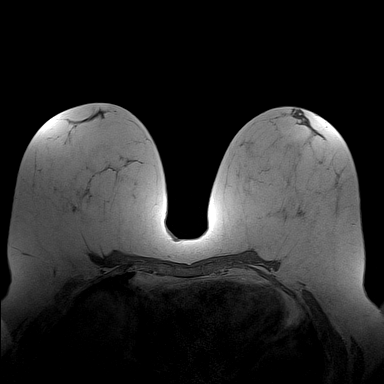
[im 72/144]
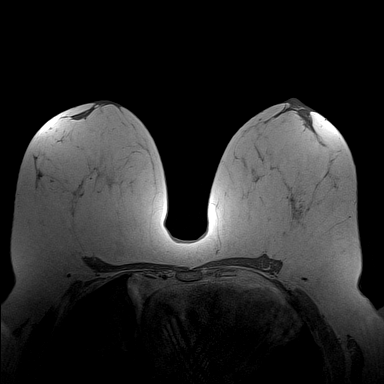
[im 86/144]
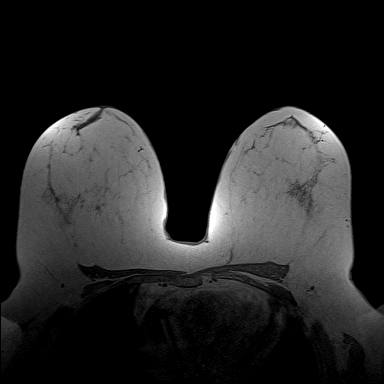
[im 101/144]
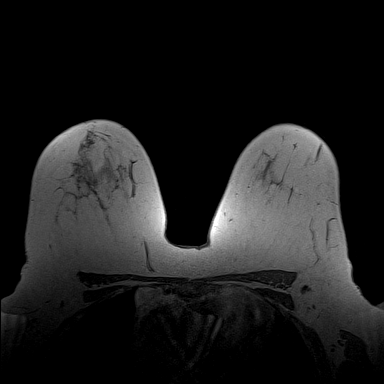
[im 115/144]
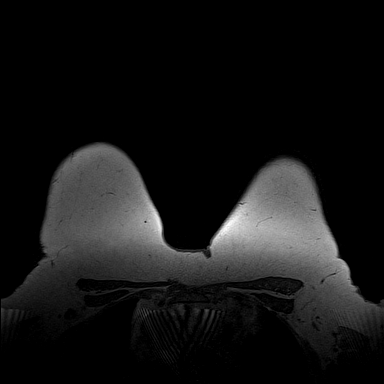
[im 129/144]
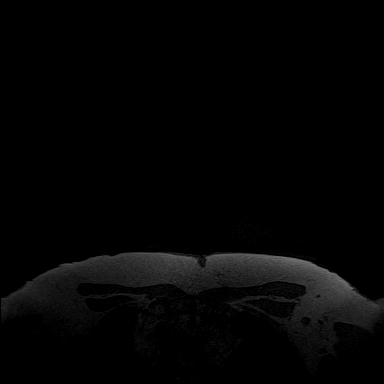
[im 144/144]
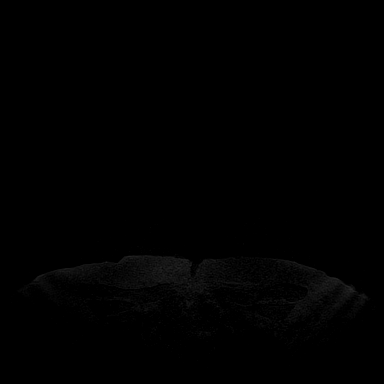

[Series 4: fl3d pre-cm · axial · non-contrast · 1.2mm · 0.99mm/px · z∈[-78,+93]mm · 11 of 144 slices shown]
[im 1/144]
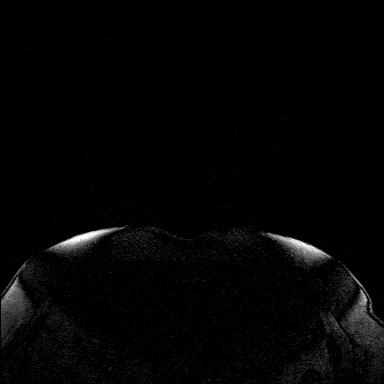
[im 15/144]
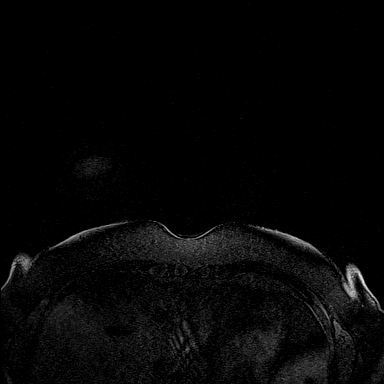
[im 29/144]
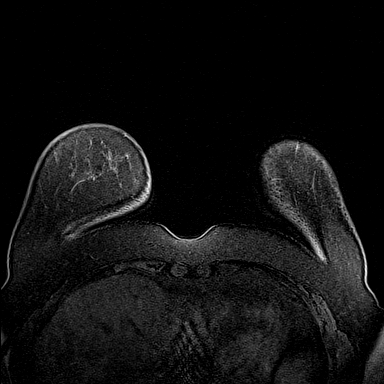
[im 43/144]
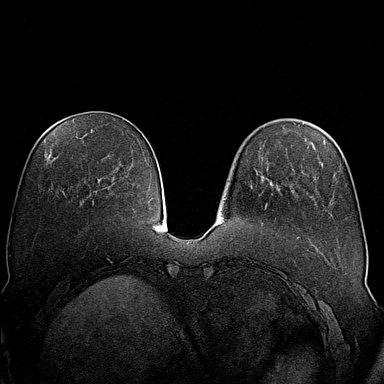
[im 58/144]
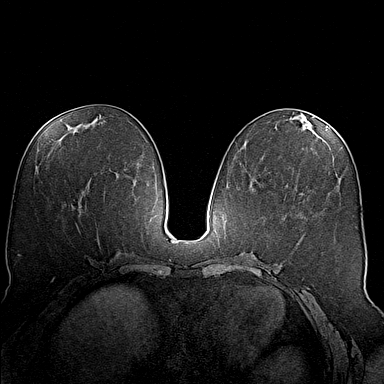
[im 72/144]
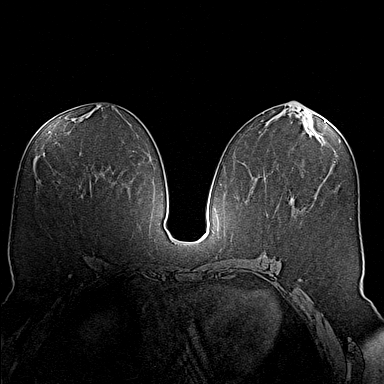
[im 86/144]
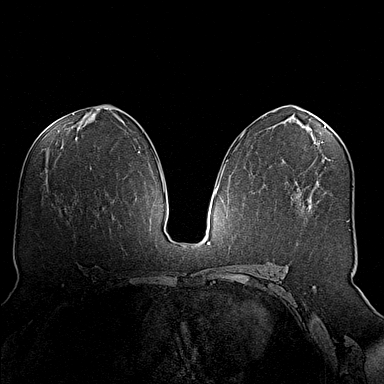
[im 101/144]
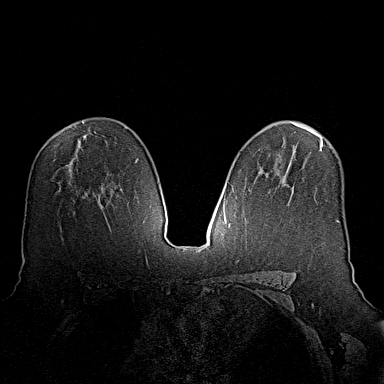
[im 115/144]
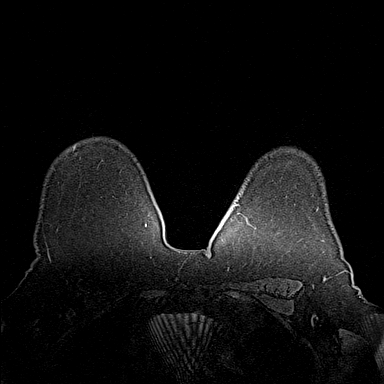
[im 129/144]
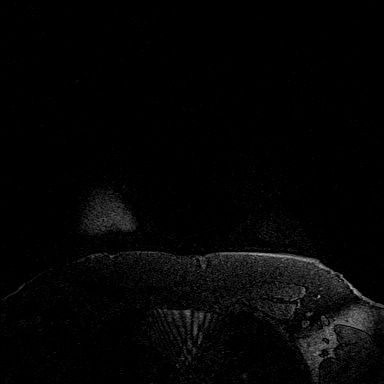
[im 144/144]
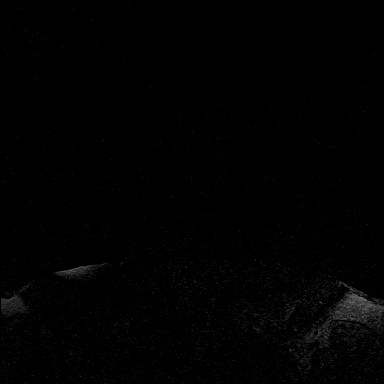

[Series 5: fl3d post-cm 20 · axial · 1.2mm · 0.99mm/px · z∈[-78,+75]mm · 7 of 144 slices shown]
[im 1/144]
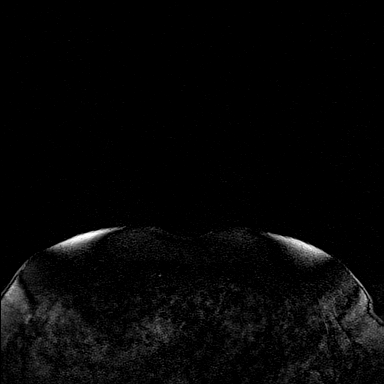
[im 15/144]
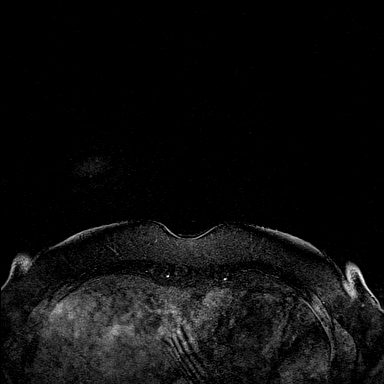
[im 29/144]
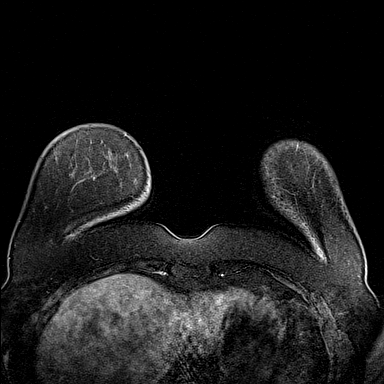
[im 43/144]
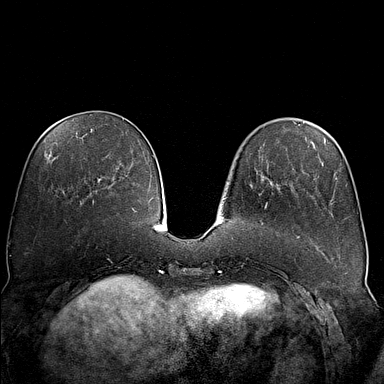
[im 58/144]
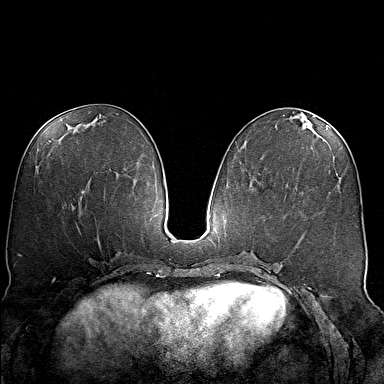
[im 72/144]
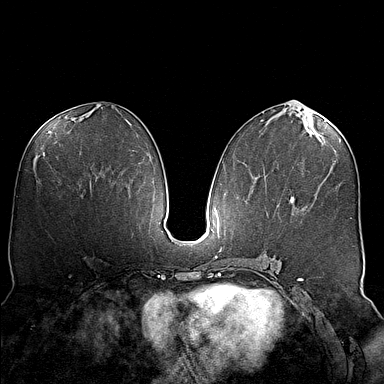
[im 129/144]
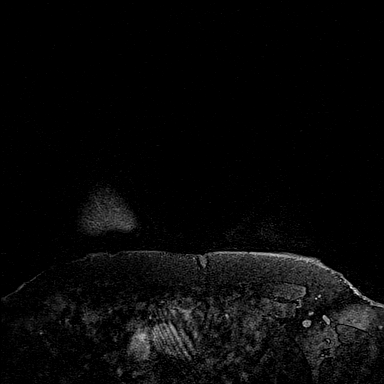

[32 of 48 positions shown; findings below may reference images not displayed]

Three-dimensional MR images were rendered by post-processing of the
original MR data on an independent workstation. The
three-dimensional MR images were interpreted, and findings are
reported in the following complete MRI report for this study. Three
dimensional images were evaluated at the independent [employer]
workstation
FINDINGS: Breast composition: b. Scattered fibroglandular tissue.

Background parenchymal enhancement: Minimal

Right breast: No mass or abnormal enhancement.

Left breast: No mass or abnormal enhancement.

Lymph nodes: No abnormal appearing lymph nodes.

Ancillary findings:  None.
IMPRESSION: No MRI evidence of malignancy in either breast.

RECOMMENDATION:
Recommend annual screening mammography. The patient is also eligible
for annual Abbreviated Breast MRI if she wishes

BI-RADS CATEGORY  1: Negative.

## 2020-11-26 MED ORDER — GADOBUTROL 1 MMOL/ML IV SOLN
10.0000 mL | Freq: Once | INTRAVENOUS | Status: AC | PRN
  Administered 2020-11-26: 10 mL via INTRAVENOUS

## 2022-01-09 DIAGNOSIS — G4733 Obstructive sleep apnea (adult) (pediatric): Secondary | ICD-10-CM | POA: Diagnosis not present

## 2022-01-25 DIAGNOSIS — Z13220 Encounter for screening for lipoid disorders: Secondary | ICD-10-CM | POA: Diagnosis not present

## 2022-01-25 DIAGNOSIS — Z6841 Body Mass Index (BMI) 40.0 and over, adult: Secondary | ICD-10-CM | POA: Diagnosis not present

## 2022-01-25 DIAGNOSIS — Z124 Encounter for screening for malignant neoplasm of cervix: Secondary | ICD-10-CM | POA: Diagnosis not present

## 2022-01-25 DIAGNOSIS — L723 Sebaceous cyst: Secondary | ICD-10-CM | POA: Diagnosis not present

## 2022-01-25 DIAGNOSIS — Z1321 Encounter for screening for nutritional disorder: Secondary | ICD-10-CM | POA: Diagnosis not present

## 2022-01-25 DIAGNOSIS — N6459 Other signs and symptoms in breast: Secondary | ICD-10-CM | POA: Diagnosis not present

## 2022-01-25 DIAGNOSIS — Z1329 Encounter for screening for other suspected endocrine disorder: Secondary | ICD-10-CM | POA: Diagnosis not present

## 2022-01-25 DIAGNOSIS — Z01419 Encounter for gynecological examination (general) (routine) without abnormal findings: Secondary | ICD-10-CM | POA: Diagnosis not present

## 2022-01-25 DIAGNOSIS — Z131 Encounter for screening for diabetes mellitus: Secondary | ICD-10-CM | POA: Diagnosis not present

## 2022-01-25 DIAGNOSIS — Z13228 Encounter for screening for other metabolic disorders: Secondary | ICD-10-CM | POA: Diagnosis not present

## 2022-01-25 DIAGNOSIS — Z130 Encounter for screening for diseases of the blood and blood-forming organs and certain disorders involving the immune mechanism: Secondary | ICD-10-CM | POA: Diagnosis not present

## 2022-01-27 ENCOUNTER — Telehealth: Payer: No Typology Code available for payment source | Admitting: Emergency Medicine

## 2022-01-27 DIAGNOSIS — L304 Erythema intertrigo: Secondary | ICD-10-CM

## 2022-01-27 MED ORDER — CLOTRIMAZOLE-BETAMETHASONE 1-0.05 % EX CREA: 1.0000 | TOPICAL_CREAM | Freq: Every day | CUTANEOUS | 0 refills | Status: DC

## 2022-02-05 DIAGNOSIS — Z8719 Personal history of other diseases of the digestive system: Secondary | ICD-10-CM | POA: Diagnosis not present

## 2022-02-05 DIAGNOSIS — J029 Acute pharyngitis, unspecified: Secondary | ICD-10-CM | POA: Diagnosis not present

## 2022-02-05 DIAGNOSIS — R051 Acute cough: Secondary | ICD-10-CM | POA: Diagnosis not present

## 2022-02-05 DIAGNOSIS — L568 Other specified acute skin changes due to ultraviolet radiation: Secondary | ICD-10-CM | POA: Diagnosis not present

## 2022-02-05 DIAGNOSIS — R21 Rash and other nonspecific skin eruption: Secondary | ICD-10-CM | POA: Diagnosis not present

## 2022-02-16 DIAGNOSIS — Z7184 Encounter for health counseling related to travel: Secondary | ICD-10-CM | POA: Diagnosis not present

## 2022-02-16 DIAGNOSIS — R051 Acute cough: Secondary | ICD-10-CM | POA: Diagnosis not present

## 2022-02-16 DIAGNOSIS — J029 Acute pharyngitis, unspecified: Secondary | ICD-10-CM | POA: Diagnosis not present

## 2022-02-16 DIAGNOSIS — L568 Other specified acute skin changes due to ultraviolet radiation: Secondary | ICD-10-CM | POA: Diagnosis not present

## 2022-02-16 DIAGNOSIS — R21 Rash and other nonspecific skin eruption: Secondary | ICD-10-CM | POA: Diagnosis not present

## 2022-02-16 DIAGNOSIS — Z8719 Personal history of other diseases of the digestive system: Secondary | ICD-10-CM | POA: Diagnosis not present

## 2022-02-17 DIAGNOSIS — H66003 Acute suppurative otitis media without spontaneous rupture of ear drum, bilateral: Secondary | ICD-10-CM | POA: Diagnosis not present

## 2022-02-17 DIAGNOSIS — Z6841 Body Mass Index (BMI) 40.0 and over, adult: Secondary | ICD-10-CM | POA: Diagnosis not present

## 2022-03-07 ENCOUNTER — Encounter: Payer: Self-pay | Admitting: Internal Medicine

## 2022-03-07 ENCOUNTER — Ambulatory Visit: Payer: 59 | Admitting: Internal Medicine

## 2022-03-07 VITALS — BP 126/82 | HR 68 | Temp 97.5°F | Resp 16 | Ht 66.5 in | Wt 251.8 lb

## 2022-03-07 DIAGNOSIS — J3089 Other allergic rhinitis: Secondary | ICD-10-CM

## 2022-03-07 DIAGNOSIS — L2084 Intrinsic (allergic) eczema: Secondary | ICD-10-CM

## 2022-03-07 MED ORDER — FLUTICASONE PROPIONATE 50 MCG/ACT NA SUSP
1.0000 | Freq: Every day | NASAL | 2 refills | Status: DC
Start: 2022-03-07 — End: 2022-08-22

## 2022-03-07 MED ORDER — HYDROCORTISONE 2.5 % EX CREA: TOPICAL_CREAM | Freq: Two times a day (BID) | CUTANEOUS | 0 refills | Status: DC

## 2022-03-07 MED ORDER — TRIAMCINOLONE ACETONIDE 0.1 % EX OINT: 1.0000 | TOPICAL_OINTMENT | Freq: Two times a day (BID) | CUTANEOUS | 0 refills | Status: DC

## 2022-03-07 MED ORDER — MONTELUKAST SODIUM 10 MG PO TABS: 10.0000 mg | ORAL_TABLET | Freq: Every day | ORAL | 1 refills | Status: DC

## 2022-03-07 NOTE — Progress Notes (Signed)
New Patient Note  RE: Crystal Barber MRN: [id number] DOB: [birth year] Date of Office Visit: 03/07/2022  Consult requested by: Scheryl Marten, PA Primary care provider: Lajean Manes, MD  Chief Complaint: Rash (Face; Neck - started about the middle of June)  History of Present Illness: I had the pleasure of seeing Crystal Barber for initial evaluation at the [facility] on 03/07/2022. She is a 61 y.o. female, who is referred here by Lajean Manes, MD for the evaluation of rash and rhinitis .  History obtained from patient  and  chart review .   Rash   Symptoms started in middle of June after renovations started on townhome that she had inherited from her significant other.  Symptoms started after 1 week after renovations started.  She does not water leaks in the house. Rash is a mildly pruritic, erythematous flakey on face and chest.  She has treated with lotion, but has not tried any topical steroids.  She has been prescribed  Also associated with eyelid edema when flares.    Chronic rhinitis: started as a child, she was on AIT as a child and felt like symptoms have imrpoved over the past 10 years  Symptoms include:  conjunctival erythema , nasal congestion, post nasal drainage, watery eyes, and itchy eyes  Occurs  has restarted since rash has started  Potential triggers: mold and dust exposure  Treatments tried: cetirizine,  Previous allergy testing:  as a child, positive to mold and dust cannot recall other specifics  History of reflux/heartburn: no History of chronic sinusitis or sinus surgery: no Nonallergic triggers:  denies     She was diagnosed with acute otitis media on 02/17/22 and given augmentin and rash did resolve.  However at that [place] was at the beach and not exposed to town home.    Assessment and Plan: Crystal Barber is a 61 y.o. female with: Intrinsic atopic dermatitis - Plan: Allergy Test, Interdermal Allergy Test  Other allergic rhinitis  - Plan: Allergy Test, Interdermal Allergy Test Plan: Patient Instructions   Atopic Dermatitis/ Eczema: not well controlled  Skin testing today was positive to Alternaria, Box Elder pollen, Candida, roach Intradermal testing was negative to mold mix 2, 3, 4 and dust mite   Based on testing and symptoms your dermatitis is likely triggered from allergic atopic dermatitis  Copy of testing provided  Avoidance measures provided   Daily Care For Maintenance (daily and continue even once eczema controlled) - Recommend hypoallergenic hydrating ointment at least twice daily.  This must be done daily for control of flares. (Great options include Vaseline, CeraVe, Aquaphor, Aveeno, Cetaphil, VaniCream, etc) - Recommend avoiding detergents, soaps or lotions with fragrances/dyes, and instead using products which are hypoallergenic, use second rinse cycle when washing clothes -Wear lose breathable clothing, avoid wool -Avoid extremes of humidity - Limit showers/baths to 5 minutes and use luke warm water instead of hot, pat dry following baths, and apply moisturizer - can use steroid creams as detailed below up to twice weekly for prevention of flares.  For Flares:(add this to maintenance therapy if needed for flares) Start triamcinolone 0.1% ointment twice a day on dry red flaky patches of skin below the neck Start hydrocortisone 2.5% cream for dry red flaky patches of skin above the neck  Allergic  Rhinitis: not well  controlled  - Continue with: Zyrtec (cetirizine) '10mg'$  tablet once daily - Start taking: Singulair (montelukast) '10mg'$  daily and Flonase (fluticasone) one spray per nostril  daily - You can use an extra dose of the antihistamine, if needed, for breakthrough symptoms.  - Consider nasal saline rinses 1-2 times daily to remove allergens from the nasal cavities as well as help with mucous clearance (this is especially helpful to do before the nasal sprays are given) - Consider allergy shots as a  means of long-term control and can reduce lifetime use of medications  - Allergy shots "re-train" and "reset" the immune system to ignore environmental allergens and decrease the resulting immune response to those allergens (sneezing, itchy watery eyes, runny nose, nasal congestion, etc).    - Allergy shots improve symptoms in 75-85%  - Allergy shots are the only potential permanent and disease modifying option  - We can discuss more at the next appointment if the medications are not working for you.  Follow up: 2 months  Thank you so much for letting me partake in your care today.  Don't hesitate to reach out if you have any additional concerns!  Roney Marion, MD  [facility] [place] [place]  Control of Mold Allergen   Mold and fungi can grow on a variety of surfaces provided certain temperature and moisture conditions exist.  Outdoor molds grow on plants, decaying vegetation and soil.  The major outdoor mold, Alternaria and Cladosporium, are found in very high numbers during hot and dry conditions.  Generally, a late Summer - Fall peak is seen for common outdoor fungal spores.  Rain will temporarily lower outdoor mold spore count, but counts rise rapidly when the rainy period ends.  The most important indoor molds are Aspergillus and Penicillium.  Dark, humid and poorly ventilated basements are ideal sites for mold growth.  The next most common sites of mold growth are the bathroom and the kitchen.  Outdoor (Seasonal) Mold Control  Positive outdoor molds via skin testing: Alternaria  Use air conditioning and keep windows closed Avoid exposure to decaying vegetation. Avoid leaf raking. Avoid grain handling. Consider wearing a face mask if working in moldy areas.    Indoor (Perennial) Mold Control   Positive indoor molds via skin testing: Candida  Maintain humidity below 50%. Clean washable surfaces with 5% bleach solution. Remove sources e.g. contaminated  carpets.   Control of Cockroach Allergen  Cockroach allergen has been identified as an important cause of acute attacks of asthma, especially in urban settings.  There are fifty-five species of cockroach that exist in the [place], however only three, the [place], [place] species produce allergen that can affect patients with Asthma.  Allergens can be obtained from fecal particles, egg casings and secretions from cockroaches.    Remove food sources. Reduce access to water. Seal access and entry points. Spray runways with 0.5-1% Diazinon or Chlorpyrifos Blow boric acid power under stoves and refrigerator. Place bait stations (hydramethylnon) at feeding sites.  No follow-ups on file.  Meds ordered this encounter  Medications   triamcinolone ointment (KENALOG) 0.1 %    Sig: Apply 1 Application topically 2 (two) times daily.    Dispense:  30 g    Refill:  0   hydrocortisone 2.5 % cream    Sig: Apply topically 2 (two) times daily.    Dispense:  30 g    Refill:  0   montelukast (SINGULAIR) 10 MG tablet    Sig: Take 1 tablet (10 mg total) by mouth at bedtime.    Dispense:  90 tablet    Refill:  1   fluticasone (  FLONASE) 50 MCG/ACT nasal spray    Sig: Place 1 spray into both nostrils daily.    Dispense:  16 g    Refill:  2   Lab Orders  No laboratory test(s) ordered today    Other allergy screening: Asthma: no Rhino conjunctivitis: yes Food allergy: no Medication allergy: no Hymenoptera allergy: no Urticaria: no Eczema:no History of recurrent infections suggestive of immunodeficency: no  Diagnostics: Skin Testing: Environmental allergy panel. Adequate controls.  Testing as below  Results interpreted by myself and discussed with patient/family.  Airborne Adult Perc - 03/07/22 1423     Time Antigen Placed 1424    Allergen Manufacturer Lavella Hammock    Location Back    Number of Test 59    Panel 1 Select    2. Control-Histamine 1 mg/ml 4+    4. [place]  Negative    5. [place] Negative    6. Johnson Negative    7. Mesquite Blue Negative    8. Meadow Fescue Negative    9. Perennial Rye Negative    10. Sweet Vernal Negative    11. Timothy Negative    12. Cocklebur Negative    13. Burweed Marshelder Negative    14. Ragweed, short Negative    15. Ragweed, Giant Negative    16. Plantain,  English Negative    17. Lamb's Quarters Negative    18. Sheep Sorrell Negative    19. Rough Pigweed Negative    20. Marsh Elder, Rough Negative    21. Mugwort, Common Negative    22. Ash mix Negative    23. Birch mix Negative    24. Beech American Negative    25. Box, Elder 3+    26. Cedar, red Negative    27. Cottonwood, [place] Negative    28. Elm mix Negative    29. Hickory Negative    30. Maple mix Negative    31. Oak, [place] mix Negative    32. Pecan Pollen Negative    33. Pine mix Negative    34. Sycamore Eastern Negative    35. Tarkio, Black Pollen Negative    36. Alternaria alternata 4+    37. Cladosporium Herbarum Negative    38. Aspergillus mix Negative    39. Penicillium mix Negative    40. Bipolaris sorokiniana (Helminthosporium) Negative    41. Drechslera spicifera (Curvularia) Negative    42. Mucor plumbeus Negative    43. Fusarium moniliforme Negative    44. Aureobasidium pullulans (pullulara) Negative    45. Rhizopus oryzae Negative    46. Botrytis cinera Negative    47. Epicoccum nigrum Negative    48. Phoma betae Negative    49. Candida Albicans 3+    50. Trichophyton mentagrophytes Negative    51. Mite, D Farinae  5,000 AU/ml Negative    52. Mite, D Pteronyssinus  5,000 AU/ml Negative    53. Cat Hair 10,000 BAU/ml Negative    54.  Dog Epithelia Negative    55. Mixed Feathers Negative    56. Horse Epithelia Negative    57. Cockroach, German 3+    58. Mouse Negative    59. Tobacco Leaf Negative             Food Perc - 03/07/22 1424       Test Information   Time Antigen Placed [place] Lavella Hammock    Location Back    Number of allergen test 10    Food Select  Food   1. Peanut Negative    2. Soybean food Negative    3. Wheat, whole Negative    4. Sesame Negative    5. Milk, cow Negative    6. Egg White, chicken Negative    7. Casein Negative    8. Shellfish mix Negative    9. Fish mix Negative    10. Cashew Negative             Intradermal - 03/07/22 1516     Time Antigen Placed 1516    Allergen Manufacturer Lavella Hammock    Location Arm    Number of Test 5    Intradermal Select    Control Negative    Mold 2 Negative    Mold 3 Negative    Mold 4 Negative    Mite mix Negative             Past Medical History: Patient Active Problem List   Diagnosis Date Noted   Endometrial polyp 12/24/2013    Class: Hospitalized for   Submucous uterine fibroid 12/24/2013    Class: Present on Admission   Status post hysteroscopic myomectomy 12/24/2013    Class: Status post   Status post hysteroscopic polypectomy 12/24/2013    Class: Status post   Dyspnea 06/06/2011   METATARSALGIA 04/27/2009   PLANTAR FASCIITIS, BILATERAL 04/27/2009   Past Medical History:  Diagnosis Date   Arthritis    FOOT   Endometrial polyp    Migraine    PVC (premature ventricular contraction)    Seasonal allergies    Wears contact lenses    Past Surgical History: Past Surgical History:  Procedure Laterality Date   DILATATION & CURRETTAGE/HYSTEROSCOPY WITH RESECTOCOPE N/A 12/24/2013   Procedure: [place];  Surgeon: Darlyn Chamber, MD;  Location: [facility];  Service: Gynecology;  Laterality: N/A;   HYSTEROSCOPY  12/2013   Medication List:  Current Outpatient Medications  Medication Sig Dispense Refill   atovaquone-proguanil (MALARONE) 250-100 MG TABS tablet Take 1 tablet by mouth daily.     cetirizine (ZYRTEC ALLERGY) 10 MG tablet Take 1 tablet by mouth daily.     Cholecalciferol (VITAMIN D3) 25 MCG (1000 UT)  CAPS Take 1 capsule by mouth daily.     fluticasone (FLONASE) 50 MCG/ACT nasal spray Place 1 spray into both nostrils daily. 16 g 2   hydrocortisone 2.5 % cream Apply topically 2 (two) times daily. 30 g 0   MAGNESIUM PO Take by mouth.     montelukast (SINGULAIR) 10 MG tablet Take 1 tablet (10 mg total) by mouth at bedtime. 90 tablet 1   Multiple Vitamin (MULTIVITAMIN) capsule Take 1 capsule by mouth daily.     NON FORMULARY Tumeric     Probiotic Product (PROBIOTIC DAILY PO) Take by mouth.     triamcinolone ointment (KENALOG) 0.1 % Apply 1 Application topically 2 (two) times daily. 30 g 0   clotrimazole-betamethasone (LOTRISONE) cream Apply 1 Application topically daily. (Patient not taking: Reported on 03/07/2022) 30 g 0   No current facility-administered medications for this visit.   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  Employer: [employer]   Tobacco Use   Smoking status: Never   Smokeless tobacco: Never  Substance and Sexual Activity   Alcohol use: Yes    Alcohol/week: 2.0 standard drinks of alcohol    Types: 2 drink(s) per week    Comment: OCCASIONAL,1-3 drinks weekly   Drug use: No   Sexual activity: Not on file  Other Topics Concern   Not on file  Social History Narrative   Patient is right handed and resides alone,consumes 2-3 caffeinated beverages daily   Social Determinants of Health   Financial Resource Strain: Not on file  Food Insecurity: Not on file  Transportation Needs: Not on file  Physical Activity: Not on file  Stress: Not on file  Social Connections: Not on file   Lives in a townhome that is currently undergoing  renovation, it was built in 2001.  There are some roaches in the house and bed is 2 feet off the floor.  There are no dust mite precautions on better pillows.  She is exposed to dust during renovation project otherwise not exposed to fumes, chemicals or dust through hobbies or job.  She does not live near an interstate industrial area Smoking: No exposure Occupation: Retired  Programme researcher, broadcasting/film/video History: Environmental education officer in the house: yes at second home she is Systems developer in the family room: no Carpet in the bedroom: yes Heating: gas Cooling: central Pet: no  Family History: Family History  Problem Relation Age of Onset   Migraines Father    Asthma Paternal Grandmother    Breast cancer Maternal Aunt    Breast cancer Maternal Aunt      ROS: All others negative except as noted per HPI.   Objective: BP 126/82   Pulse 68   Temp (!) 97.5 F (36.4 C)   Resp 16   Ht 5' 6.5" (1.689 m)   Wt 251 lb 12.8 oz (114.2 kg)   SpO2 98%   BMI 40.03 kg/m  Body mass index is 40.03 kg/m.  General Appearance:  Alert, cooperative, no distress, appears stated age  Head:  Normocephalic, without obvious abnormality, atraumatic  Eyes:  Conjunctiva clear, EOM's intact  Nose: Nares normal,  erythematous nasal mucosa, no visible anterior polyps, and septum midline  Throat: Lips, tongue normal; teeth and gums normal, normal posterior oropharynx and no tonsillar exudate  Neck: Supple, symmetrical  Lungs:   clear to auscultation bilaterally, Respirations unlabored, no coughing  Heart:  regular rate and rhythm and no murmur, Appears well perfused  Extremities: No edema  Skin: Skin color, texture, turgor normal,  eczematous patches on chest   Neurologic: No gross deficits   The plan was reviewed with the patient/family, and all questions/concerned were addressed.  It was my pleasure to see Crystal Barber today and participate in her care. Please feel free to contact me with any questions or  concerns.  Sincerely,  Roney Marion, MD Allergy & Immunology  [facility] office: [phone] [facility] office: [phone]

## 2022-03-07 NOTE — Patient Instructions (Addendum)
Atopic Dermatitis/ Eczema: not well controlled  Skin testing today was positive to Alternaria, Box Elder pollen, Candida, roach Intradermal testing was negative to mold mix 2, 3, 4 and dust mite   Based on testing and symptoms your dermatitis is likely triggered from allergic atopic dermatitis  Copy of testing provided  Avoidance measures provided   Daily Care For Maintenance (daily and continue even once eczema controlled) - Recommend hypoallergenic hydrating ointment at least twice daily.  This must be done daily for control of flares. (Great options include Vaseline, CeraVe, Aquaphor, Aveeno, Cetaphil, VaniCream, etc) - Recommend avoiding detergents, soaps or lotions with fragrances/dyes, and instead using products which are hypoallergenic, use second rinse cycle when washing clothes -Wear lose breathable clothing, avoid wool -Avoid extremes of humidity - Limit showers/baths to 5 minutes and use luke warm water instead of hot, pat dry following baths, and apply moisturizer - can use steroid creams as detailed below up to twice weekly for prevention of flares.  For Flares:(add this to maintenance therapy if needed for flares) Start triamcinolone 0.1% ointment twice a day on dry red flaky patches of skin below the neck Start hydrocortisone 2.5% cream for dry red flaky patches of skin above the neck  Allergic  Rhinitis: not well  controlled  - Continue with: Zyrtec (cetirizine) '10mg'$  tablet once daily - Start taking: Singulair (montelukast) '10mg'$  daily and Flonase (fluticasone) one spray per nostril daily - You can use an extra dose of the antihistamine, if needed, for breakthrough symptoms.  - Consider nasal saline rinses 1-2 times daily to remove allergens from the nasal cavities as well as help with mucous clearance (this is especially helpful to do before the nasal sprays are given) - Consider allergy shots as a means of long-term control and can reduce lifetime use of medications  -  Allergy shots "re-train" and "reset" the immune system to ignore environmental allergens and decrease the resulting immune response to those allergens (sneezing, itchy watery eyes, runny nose, nasal congestion, etc).    - Allergy shots improve symptoms in 75-85%  - Allergy shots are the only potential permanent and disease modifying option  - We can discuss more at the next appointment if the medications are not working for you.  Follow up: 2 months  Thank you so much for letting me partake in your care today.  Don't hesitate to reach out if you have any additional concerns!  Roney Marion, MD  [facility] [place] [place]  Control of Mold Allergen   Mold and fungi can grow on a variety of surfaces provided certain temperature and moisture conditions exist.  Outdoor molds grow on plants, decaying vegetation and soil.  The major outdoor mold, Alternaria and Cladosporium, are found in very high numbers during hot and dry conditions.  Generally, a late Summer - Fall peak is seen for common outdoor fungal spores.  Rain will temporarily lower outdoor mold spore count, but counts rise rapidly when the rainy period ends.  The most important indoor molds are Aspergillus and Penicillium.  Dark, humid and poorly ventilated basements are ideal sites for mold growth.  The next most common sites of mold growth are the bathroom and the kitchen.  Outdoor (Seasonal) Mold Control  Positive outdoor molds via skin testing: Alternaria  Use air conditioning and keep windows closed Avoid exposure to decaying vegetation. Avoid leaf raking. Avoid grain handling. Consider wearing a face mask if working in moldy areas.    Indoor (Perennial) Mold Control  Positive indoor molds via skin testing: Candida  Maintain humidity below 50%. Clean washable surfaces with 5% bleach solution. Remove sources e.g. contaminated carpets.   Control of Cockroach Allergen  Cockroach allergen has been  identified as an important cause of acute attacks of asthma, especially in urban settings.  There are fifty-five species of cockroach that exist in the [place], however only three, the [place], [place] species produce allergen that can affect patients with Asthma.  Allergens can be obtained from fecal particles, egg casings and secretions from cockroaches.    Remove food sources. Reduce access to water. Seal access and entry points. Spray runways with 0.5-1% Diazinon or Chlorpyrifos Blow boric acid power under stoves and refrigerator. Place bait stations (hydramethylnon) at feeding sites.

## 2022-03-19 DIAGNOSIS — Z7184 Encounter for health counseling related to travel: Secondary | ICD-10-CM | POA: Diagnosis not present

## 2022-05-02 DIAGNOSIS — G4733 Obstructive sleep apnea (adult) (pediatric): Secondary | ICD-10-CM | POA: Diagnosis not present

## 2022-05-08 ENCOUNTER — Encounter: Payer: Self-pay | Admitting: Internal Medicine

## 2022-05-08 ENCOUNTER — Ambulatory Visit: Payer: 59 | Admitting: Internal Medicine

## 2022-05-08 VITALS — BP 120/80 | HR 66 | Temp 97.5°F | Resp 16 | Ht 66.54 in | Wt 250.8 lb

## 2022-05-08 DIAGNOSIS — J3089 Other allergic rhinitis: Secondary | ICD-10-CM

## 2022-05-08 DIAGNOSIS — L2084 Intrinsic (allergic) eczema: Secondary | ICD-10-CM | POA: Diagnosis not present

## 2022-05-08 DIAGNOSIS — Z1231 Encounter for screening mammogram for malignant neoplasm of breast: Secondary | ICD-10-CM | POA: Diagnosis not present

## 2022-05-08 DIAGNOSIS — J302 Other seasonal allergic rhinitis: Secondary | ICD-10-CM

## 2022-05-08 NOTE — Patient Instructions (Addendum)
Atopic Dermatitis/ Eczema: well controlled  Testing: 03/07/22 positive to Alternaria, Box Elder pollen, Candida, roach Intradermal testing was negative to mold mix 2, 3, 4 and dust mite  Continue avoidance measures   Daily Care For Maintenance (daily and continue even once eczema controlled) - Recommend hypoallergenic hydrating ointment at least twice daily.  This must be done daily for control of flares. (Great options include Vaseline, CeraVe, Aquaphor, Aveeno, Cetaphil, VaniCream, etc) - Recommend avoiding detergents, soaps or lotions with fragrances/dyes, and instead using products which are hypoallergenic, use second rinse cycle when washing clothes -Wear lose breathable clothing, avoid wool -Avoid extremes of humidity - Limit showers/baths to 5 minutes and use luke warm water instead of hot, pat dry following baths, and apply moisturizer - can use steroid creams as detailed below up to twice weekly for prevention of flares.  For Flares:(add this to maintenance therapy if needed for flares) Continue  triamcinolone 0.1% ointment twice a day on dry red flaky patches of skin below the neck Continue hydrocortisone 2.5% cream for dry red flaky patches of skin above the neck  Allergic  Rhinitis: well controlled  - Continue with: Zyrtec (cetirizine) '10mg'$  tablet once daily, Singulair '10mg'$  daily, flonase one spray per nostril dialy  - Taper plan: Once you finish renovating recommend stopping Singulair first for a week, followed by Flonase followed by Zyrtec  -Should symptoms recur just restart in the reverse order - You can use an extra dose of the antihistamine, if needed, for breakthrough symptoms.  - Consider nasal saline rinses 1-2 times daily to remove allergens from the nasal cavities as well as help with mucous clearance (this is especially helpful to do before the nasal sprays are given) - Consider allergy shots as a means of long-term control and can reduce lifetime use of medications  -  Allergy shots "re-train" and "reset" the immune system to ignore environmental allergens and decrease the resulting immune response to those allergens (sneezing, itchy watery eyes, runny nose, nasal congestion, etc).    - Allergy shots improve symptoms in 75-85%  - Allergy shots are the only potential permanent and disease modifying option  - We can discuss more at the next appointment if the medications are not working for you.  Follow up: 4 months   Thank you so much for letting me partake in your care today.  Don't hesitate to reach out if you have any additional concerns!  Roney Marion, MD  [facility] [place]

## 2022-05-08 NOTE — Progress Notes (Signed)
Follow Up Note  RE: Crystal Barber MRN: [id number] DOB: 06/23/1961 Date of Office Visit: 05/08/2022  Referring provider: Lajean Manes, MD Primary care provider: Lajean Manes, MD  Chief Complaint: Allergic Rhinitis  and Rash (Rash has resolved)  History of Present Illness: I had the pleasure of seeing Crystal Barber for a follow up visit at the [facility] on 05/08/2022. She is a 61 y.o. female, who is being followed for atopic dermatitis, allergic rhinitis . Her previous allergy office visit was on 03/07/22 with Dr. Edison Pace. Today is a regular follow up visit.  History obtained from patient, chart review .  Since last visit eczema flares resolved within 72 hours of starting triamcinolone and hydrocortisone.  She is continued the Zyrtec, Singulair and Flonase daily with excellent response for rhinitis symptoms.  She is 1 more month for cleaning out old house and she plans to have the ducts cleaned out as well.  She is wondering if she to be on all these medications permanently or once the house cleaning is done will be able to taper down her medications.  Denies any side effects.  Otherwise feels like allergy symptoms are very well controlled.  Assessment and Plan: Syan is a 61 y.o. female with: Seasonal and perennial allergic rhinitis  Intrinsic atopic dermatitis Plan: Patient Instructions   Atopic Dermatitis/ Eczema: well controlled  Testing: 03/07/22 positive to Alternaria, Box Elder pollen, Candida, roach Intradermal testing was negative to mold mix 2, 3, 4 and dust mite  Continue avoidance measures   Daily Care For Maintenance (daily and continue even once eczema controlled) - Recommend hypoallergenic hydrating ointment at least twice daily.  This must be done daily for control of flares. (Great options include Vaseline, CeraVe, Aquaphor, Aveeno, Cetaphil, VaniCream, etc) - Recommend avoiding detergents, soaps or lotions with fragrances/dyes, and instead  using products which are hypoallergenic, use second rinse cycle when washing clothes -Wear lose breathable clothing, avoid wool -Avoid extremes of humidity - Limit showers/baths to 5 minutes and use luke warm water instead of hot, pat dry following baths, and apply moisturizer - can use steroid creams as detailed below up to twice weekly for prevention of flares.  For Flares:(add this to maintenance therapy if needed for flares) Continue  triamcinolone 0.1% ointment twice a day on dry red flaky patches of skin below the neck Continue hydrocortisone 2.5% cream for dry red flaky patches of skin above the neck  Allergic  Rhinitis: well controlled  - Continue with: Zyrtec (cetirizine) '10mg'$  tablet once daily, Singulair '10mg'$  daily, flonase one spray per nostril dialy  - Taper plan: Once you finish renovating recommend stopping Singulair first for a week, followed by Flonase followed by Zyrtec  -Should symptoms recur just restart in the reverse order - You can use an extra dose of the antihistamine, if needed, for breakthrough symptoms.  - Consider nasal saline rinses 1-2 times daily to remove allergens from the nasal cavities as well as help with mucous clearance (this is especially helpful to do before the nasal sprays are given) - Consider allergy shots as a means of long-term control and can reduce lifetime use of medications  - Allergy shots "re-train" and "reset" the immune system to ignore environmental allergens and decrease the resulting immune response to those allergens (sneezing, itchy watery eyes, runny nose, nasal congestion, etc).    - Allergy shots improve symptoms in 75-85%  - Allergy shots are the only potential permanent and disease modifying option  -  We can discuss more at the next appointment if the medications are not working for you.  Follow up: 4 months   Thank you so much for letting me partake in your care today.  Don't hesitate to reach out if you have any additional  concerns!  Roney Marion, MD  [facility] [place] [place]   No follow-ups on file.  No orders of the defined types were placed in this encounter.   Lab Orders  No laboratory test(s) ordered today   Diagnostics: None done    Medication List:  Current Outpatient Medications  Medication Sig Dispense Refill   cetirizine (ZYRTEC ALLERGY) 10 MG tablet Take 1 tablet by mouth daily.     Cholecalciferol (VITAMIN D3) 25 MCG (1000 UT) CAPS Take 1 capsule by mouth daily.     fluticasone (FLONASE) 50 MCG/ACT nasal spray Place 1 spray into both nostrils daily. 16 g 2   MAGNESIUM PO Take by mouth.     montelukast (SINGULAIR) 10 MG tablet Take 1 tablet (10 mg total) by mouth at bedtime. 90 tablet 1   Multiple Vitamin (MULTIVITAMIN) capsule Take 1 capsule by mouth daily.     NON FORMULARY Tumeric     Probiotic Product (PROBIOTIC DAILY PO) Take by mouth.     triamcinolone cream (KENALOG) 0.1 % APPLY 1 APPLICATION TO CHEST TWICE A DAY FOR 2 WEEKS, AVOID USE ON FACE DUE TO SKIN THINNING     No current facility-administered medications for this visit.   Allergies: Allergies  Allergen Reactions   Maxalt [Rizatriptan] Nausea Only   Sumatriptan Nausea And Vomiting   I reviewed her past medical history, social history, family history, and environmental history and no significant changes have been reported from her previous visit.  ROS: All others negative except as noted per HPI.   Objective: BP 120/80 (BP Location: Left Arm, Patient Position: Sitting, Cuff Size: Large)   Pulse 66   Temp (!) 97.5 F (36.4 C) (Temporal)   Resp 16   Ht 5' 6.54" (1.69 m)   Wt 250 lb 12.8 oz (113.8 kg)   SpO2 99%   BMI 39.83 kg/m  Body mass index is 39.83 kg/m. General Appearance:  Alert, cooperative, no distress, appears stated age  Head:  Normocephalic, without obvious abnormality, atraumatic  Eyes:  Conjunctiva clear, EOM's intact  Nose: Nares normal,   Throat: Lips, tongue normal;  teeth and gums normal,   Neck: Supple, symmetrical  Lungs:   , Respirations unlabored, no coughing  Heart:  , Appears well perfused  Extremities: No edema  Skin: Skin color, texture, turgor normal, no rashes or lesions on visualized portions of skin   Neurologic: No gross deficits   Previous notes and tests were reviewed. The plan was reviewed with the patient/family, and all questions/concerned were addressed.  It was my pleasure to see Shanina today and participate in her care. Please feel free to contact me with any questions or concerns.  Sincerely,  Roney Marion, MD  Allergy & Immunology  [facility] Office: [phone]

## 2022-05-22 DIAGNOSIS — Z6841 Body Mass Index (BMI) 40.0 and over, adult: Secondary | ICD-10-CM | POA: Diagnosis not present

## 2022-05-22 DIAGNOSIS — Z713 Dietary counseling and surveillance: Secondary | ICD-10-CM | POA: Diagnosis not present

## 2022-06-01 DIAGNOSIS — G4733 Obstructive sleep apnea (adult) (pediatric): Secondary | ICD-10-CM | POA: Diagnosis not present

## 2022-06-04 DIAGNOSIS — E669 Obesity, unspecified: Secondary | ICD-10-CM | POA: Diagnosis not present

## 2022-06-04 DIAGNOSIS — G4733 Obstructive sleep apnea (adult) (pediatric): Secondary | ICD-10-CM | POA: Diagnosis not present

## 2022-06-06 ENCOUNTER — Encounter (INDEPENDENT_AMBULATORY_CARE_PROVIDER_SITE_OTHER): Payer: 59 | Admitting: Family Medicine

## 2022-06-06 ENCOUNTER — Encounter (INDEPENDENT_AMBULATORY_CARE_PROVIDER_SITE_OTHER): Payer: Self-pay

## 2022-06-21 DIAGNOSIS — G4733 Obstructive sleep apnea (adult) (pediatric): Secondary | ICD-10-CM | POA: Diagnosis not present

## 2022-06-21 DIAGNOSIS — Z1331 Encounter for screening for depression: Secondary | ICD-10-CM | POA: Diagnosis not present

## 2022-06-21 DIAGNOSIS — R5383 Other fatigue: Secondary | ICD-10-CM | POA: Diagnosis not present

## 2022-06-21 DIAGNOSIS — Z6841 Body Mass Index (BMI) 40.0 and over, adult: Secondary | ICD-10-CM | POA: Diagnosis not present

## 2022-07-02 DIAGNOSIS — G4733 Obstructive sleep apnea (adult) (pediatric): Secondary | ICD-10-CM | POA: Diagnosis not present

## 2022-07-04 DIAGNOSIS — G4733 Obstructive sleep apnea (adult) (pediatric): Secondary | ICD-10-CM | POA: Diagnosis not present

## 2022-07-04 DIAGNOSIS — Z6841 Body Mass Index (BMI) 40.0 and over, adult: Secondary | ICD-10-CM | POA: Diagnosis not present

## 2022-07-04 DIAGNOSIS — E65 Localized adiposity: Secondary | ICD-10-CM | POA: Diagnosis not present

## 2022-08-10 DIAGNOSIS — M542 Cervicalgia: Secondary | ICD-10-CM | POA: Diagnosis not present

## 2022-08-16 DIAGNOSIS — G4733 Obstructive sleep apnea (adult) (pediatric): Secondary | ICD-10-CM | POA: Diagnosis not present

## 2022-08-22 ENCOUNTER — Other Ambulatory Visit: Payer: Self-pay | Admitting: Internal Medicine

## 2022-08-24 DIAGNOSIS — M542 Cervicalgia: Secondary | ICD-10-CM | POA: Diagnosis not present

## 2022-08-24 DIAGNOSIS — M791 Myalgia, unspecified site: Secondary | ICD-10-CM | POA: Diagnosis not present

## 2022-09-11 DIAGNOSIS — E65 Localized adiposity: Secondary | ICD-10-CM | POA: Diagnosis not present

## 2022-09-11 DIAGNOSIS — G4733 Obstructive sleep apnea (adult) (pediatric): Secondary | ICD-10-CM | POA: Diagnosis not present

## 2022-09-11 DIAGNOSIS — Z6839 Body mass index (BMI) 39.0-39.9, adult: Secondary | ICD-10-CM | POA: Diagnosis not present

## 2022-09-19 ENCOUNTER — Other Ambulatory Visit: Payer: Self-pay | Admitting: Internal Medicine

## 2022-10-03 DIAGNOSIS — M542 Cervicalgia: Secondary | ICD-10-CM | POA: Diagnosis not present

## 2022-10-03 DIAGNOSIS — M791 Myalgia, unspecified site: Secondary | ICD-10-CM | POA: Diagnosis not present

## 2022-10-08 ENCOUNTER — Other Ambulatory Visit (HOSPITAL_BASED_OUTPATIENT_CLINIC_OR_DEPARTMENT_OTHER): Payer: Self-pay

## 2022-10-08 MED ORDER — TIRZEPATIDE-WEIGHT MANAGEMENT 5 MG/0.5ML ~~LOC~~ SOAJ
5.0000 mg | SUBCUTANEOUS | 0 refills | Status: DC
  Filled 2022-10-08: qty 2, 28d supply, fill #0

## 2022-10-11 DIAGNOSIS — E65 Localized adiposity: Secondary | ICD-10-CM | POA: Diagnosis not present

## 2022-10-11 DIAGNOSIS — Z6838 Body mass index (BMI) 38.0-38.9, adult: Secondary | ICD-10-CM | POA: Diagnosis not present

## 2022-10-11 DIAGNOSIS — G4733 Obstructive sleep apnea (adult) (pediatric): Secondary | ICD-10-CM | POA: Diagnosis not present

## 2022-10-13 ENCOUNTER — Other Ambulatory Visit (HOSPITAL_BASED_OUTPATIENT_CLINIC_OR_DEPARTMENT_OTHER): Payer: Self-pay

## 2022-10-31 ENCOUNTER — Other Ambulatory Visit (HOSPITAL_BASED_OUTPATIENT_CLINIC_OR_DEPARTMENT_OTHER): Payer: Self-pay

## 2022-10-31 MED ORDER — TIRZEPATIDE-WEIGHT MANAGEMENT 7.5 MG/0.5ML ~~LOC~~ SOAJ
7.5000 mg | SUBCUTANEOUS | 0 refills | Status: DC
  Filled 2022-10-31: qty 2, 28d supply, fill #0

## 2022-11-02 ENCOUNTER — Other Ambulatory Visit (HOSPITAL_BASED_OUTPATIENT_CLINIC_OR_DEPARTMENT_OTHER): Payer: Self-pay

## 2022-11-05 ENCOUNTER — Other Ambulatory Visit (HOSPITAL_BASED_OUTPATIENT_CLINIC_OR_DEPARTMENT_OTHER): Payer: Self-pay

## 2022-11-05 MED ORDER — ZEPBOUND 5 MG/0.5ML ~~LOC~~ SOAJ
5.0000 mg | SUBCUTANEOUS | 0 refills | Status: DC
Start: 2022-11-05 — End: 2022-12-25
  Filled 2022-11-05: qty 2, 28d supply, fill #0

## 2022-11-08 ENCOUNTER — Other Ambulatory Visit (HOSPITAL_BASED_OUTPATIENT_CLINIC_OR_DEPARTMENT_OTHER): Payer: Self-pay

## 2022-11-13 ENCOUNTER — Other Ambulatory Visit (HOSPITAL_BASED_OUTPATIENT_CLINIC_OR_DEPARTMENT_OTHER): Payer: Self-pay

## 2022-11-20 ENCOUNTER — Other Ambulatory Visit (HOSPITAL_BASED_OUTPATIENT_CLINIC_OR_DEPARTMENT_OTHER): Payer: Self-pay

## 2022-11-20 DIAGNOSIS — G4733 Obstructive sleep apnea (adult) (pediatric): Secondary | ICD-10-CM | POA: Diagnosis not present

## 2022-11-20 DIAGNOSIS — E65 Localized adiposity: Secondary | ICD-10-CM | POA: Diagnosis not present

## 2022-11-20 DIAGNOSIS — Z6838 Body mass index (BMI) 38.0-38.9, adult: Secondary | ICD-10-CM | POA: Diagnosis not present

## 2022-11-21 ENCOUNTER — Other Ambulatory Visit: Payer: Self-pay

## 2022-11-22 ENCOUNTER — Other Ambulatory Visit (HOSPITAL_BASED_OUTPATIENT_CLINIC_OR_DEPARTMENT_OTHER): Payer: Self-pay

## 2022-12-04 DIAGNOSIS — G4733 Obstructive sleep apnea (adult) (pediatric): Secondary | ICD-10-CM | POA: Diagnosis not present

## 2022-12-13 ENCOUNTER — Other Ambulatory Visit (HOSPITAL_BASED_OUTPATIENT_CLINIC_OR_DEPARTMENT_OTHER): Payer: Self-pay

## 2022-12-13 ENCOUNTER — Encounter (HOSPITAL_BASED_OUTPATIENT_CLINIC_OR_DEPARTMENT_OTHER): Payer: Self-pay | Admitting: Pharmacist

## 2022-12-13 MED ORDER — ZEPBOUND 7.5 MG/0.5ML ~~LOC~~ SOAJ
7.5000 mg | SUBCUTANEOUS | 0 refills | Status: DC
Start: 2022-12-13 — End: 2023-01-21
  Filled 2022-12-13: qty 2, 28d supply, fill #0

## 2022-12-20 DIAGNOSIS — Z85828 Personal history of other malignant neoplasm of skin: Secondary | ICD-10-CM | POA: Diagnosis not present

## 2022-12-20 DIAGNOSIS — J309 Allergic rhinitis, unspecified: Secondary | ICD-10-CM | POA: Diagnosis not present

## 2022-12-20 DIAGNOSIS — R030 Elevated blood-pressure reading, without diagnosis of hypertension: Secondary | ICD-10-CM | POA: Diagnosis not present

## 2022-12-20 DIAGNOSIS — G4733 Obstructive sleep apnea (adult) (pediatric): Secondary | ICD-10-CM | POA: Diagnosis not present

## 2022-12-20 DIAGNOSIS — Z6838 Body mass index (BMI) 38.0-38.9, adult: Secondary | ICD-10-CM | POA: Diagnosis not present

## 2022-12-24 ENCOUNTER — Other Ambulatory Visit (HOSPITAL_BASED_OUTPATIENT_CLINIC_OR_DEPARTMENT_OTHER): Payer: Self-pay

## 2022-12-25 ENCOUNTER — Other Ambulatory Visit (HOSPITAL_BASED_OUTPATIENT_CLINIC_OR_DEPARTMENT_OTHER): Payer: Self-pay

## 2022-12-25 DIAGNOSIS — G4733 Obstructive sleep apnea (adult) (pediatric): Secondary | ICD-10-CM | POA: Diagnosis not present

## 2022-12-25 DIAGNOSIS — E65 Localized adiposity: Secondary | ICD-10-CM | POA: Diagnosis not present

## 2022-12-25 DIAGNOSIS — Z6838 Body mass index (BMI) 38.0-38.9, adult: Secondary | ICD-10-CM | POA: Diagnosis not present

## 2022-12-25 MED ORDER — ZEPBOUND 5 MG/0.5ML ~~LOC~~ SOAJ: SUBCUTANEOUS | 0 refills | Status: AC

## 2022-12-25 MED ORDER — ZEPBOUND 10 MG/0.5ML ~~LOC~~ SOAJ: SUBCUTANEOUS | 0 refills | Status: AC

## 2023-01-16 DIAGNOSIS — G4733 Obstructive sleep apnea (adult) (pediatric): Secondary | ICD-10-CM | POA: Diagnosis not present

## 2023-01-17 ENCOUNTER — Other Ambulatory Visit (HOSPITAL_BASED_OUTPATIENT_CLINIC_OR_DEPARTMENT_OTHER): Payer: Self-pay

## 2023-01-17 MED ORDER — ZEPBOUND 10 MG/0.5ML ~~LOC~~ SOAJ
10.0000 mg | SUBCUTANEOUS | 0 refills | Status: AC
  Filled 2023-01-17: qty 2, 28d supply, fill #0

## 2023-01-21 ENCOUNTER — Other Ambulatory Visit (HOSPITAL_BASED_OUTPATIENT_CLINIC_OR_DEPARTMENT_OTHER): Payer: Self-pay

## 2023-01-21 MED ORDER — ZEPBOUND 7.5 MG/0.5ML ~~LOC~~ SOAJ
SUBCUTANEOUS | 0 refills | Status: AC
  Filled 2023-01-21: qty 2, 30d supply, fill #0

## 2023-01-22 ENCOUNTER — Other Ambulatory Visit (HOSPITAL_BASED_OUTPATIENT_CLINIC_OR_DEPARTMENT_OTHER): Payer: Self-pay

## 2023-01-22 DIAGNOSIS — G4733 Obstructive sleep apnea (adult) (pediatric): Secondary | ICD-10-CM | POA: Diagnosis not present

## 2023-01-22 DIAGNOSIS — Z6837 Body mass index (BMI) 37.0-37.9, adult: Secondary | ICD-10-CM | POA: Diagnosis not present

## 2023-01-22 DIAGNOSIS — E65 Localized adiposity: Secondary | ICD-10-CM | POA: Diagnosis not present

## 2023-01-22 MED ORDER — ZEPBOUND 10 MG/0.5ML ~~LOC~~ SOAJ
10.0000 mg | SUBCUTANEOUS | 0 refills | Status: AC
  Filled 2023-01-22: qty 2, 28d supply, fill #0

## 2023-01-28 ENCOUNTER — Other Ambulatory Visit (HOSPITAL_BASED_OUTPATIENT_CLINIC_OR_DEPARTMENT_OTHER): Payer: Self-pay

## 2023-02-08 ENCOUNTER — Other Ambulatory Visit (HOSPITAL_BASED_OUTPATIENT_CLINIC_OR_DEPARTMENT_OTHER): Payer: Self-pay

## 2023-02-26 ENCOUNTER — Other Ambulatory Visit (HOSPITAL_BASED_OUTPATIENT_CLINIC_OR_DEPARTMENT_OTHER): Payer: Self-pay

## 2023-02-26 DIAGNOSIS — R600 Localized edema: Secondary | ICD-10-CM | POA: Diagnosis not present

## 2023-02-26 DIAGNOSIS — E65 Localized adiposity: Secondary | ICD-10-CM | POA: Diagnosis not present

## 2023-02-26 DIAGNOSIS — G4733 Obstructive sleep apnea (adult) (pediatric): Secondary | ICD-10-CM | POA: Diagnosis not present

## 2023-02-26 DIAGNOSIS — E7800 Pure hypercholesterolemia, unspecified: Secondary | ICD-10-CM | POA: Diagnosis not present

## 2023-02-26 DIAGNOSIS — R5383 Other fatigue: Secondary | ICD-10-CM | POA: Diagnosis not present

## 2023-02-26 DIAGNOSIS — Z6836 Body mass index (BMI) 36.0-36.9, adult: Secondary | ICD-10-CM | POA: Diagnosis not present

## 2023-02-26 MED ORDER — ZEPBOUND 10 MG/0.5ML ~~LOC~~ SOAJ
10.0000 mg | SUBCUTANEOUS | 0 refills | Status: AC
  Filled 2023-02-26: qty 2, 28d supply, fill #0

## 2023-03-28 ENCOUNTER — Other Ambulatory Visit (HOSPITAL_BASED_OUTPATIENT_CLINIC_OR_DEPARTMENT_OTHER): Payer: Self-pay

## 2023-03-28 DIAGNOSIS — Z6835 Body mass index (BMI) 35.0-35.9, adult: Secondary | ICD-10-CM | POA: Diagnosis not present

## 2023-03-28 DIAGNOSIS — R5383 Other fatigue: Secondary | ICD-10-CM | POA: Diagnosis not present

## 2023-03-28 DIAGNOSIS — E7800 Pure hypercholesterolemia, unspecified: Secondary | ICD-10-CM | POA: Diagnosis not present

## 2023-03-28 DIAGNOSIS — E65 Localized adiposity: Secondary | ICD-10-CM | POA: Diagnosis not present

## 2023-03-28 DIAGNOSIS — G4733 Obstructive sleep apnea (adult) (pediatric): Secondary | ICD-10-CM | POA: Diagnosis not present

## 2023-03-28 MED ORDER — ZEPBOUND 10 MG/0.5ML ~~LOC~~ SOAJ
10.0000 mg | SUBCUTANEOUS | 1 refills | Status: AC
  Filled 2023-03-28: qty 2, 28d supply, fill #0

## 2023-03-29 ENCOUNTER — Other Ambulatory Visit (HOSPITAL_BASED_OUTPATIENT_CLINIC_OR_DEPARTMENT_OTHER): Payer: Self-pay

## 2023-05-07 ENCOUNTER — Other Ambulatory Visit (HOSPITAL_BASED_OUTPATIENT_CLINIC_OR_DEPARTMENT_OTHER): Payer: Self-pay

## 2023-05-07 DIAGNOSIS — R5383 Other fatigue: Secondary | ICD-10-CM | POA: Diagnosis not present

## 2023-05-07 DIAGNOSIS — E7800 Pure hypercholesterolemia, unspecified: Secondary | ICD-10-CM | POA: Diagnosis not present

## 2023-05-07 DIAGNOSIS — Z6835 Body mass index (BMI) 35.0-35.9, adult: Secondary | ICD-10-CM | POA: Diagnosis not present

## 2023-05-07 DIAGNOSIS — E65 Localized adiposity: Secondary | ICD-10-CM | POA: Diagnosis not present

## 2023-05-07 DIAGNOSIS — G4733 Obstructive sleep apnea (adult) (pediatric): Secondary | ICD-10-CM | POA: Diagnosis not present

## 2023-05-07 MED ORDER — ZEPBOUND 10 MG/0.5ML ~~LOC~~ SOAJ
10.0000 mg | SUBCUTANEOUS | 0 refills | Status: AC
  Filled 2023-05-07: qty 2, 28d supply, fill #0

## 2023-06-04 ENCOUNTER — Other Ambulatory Visit: Payer: Self-pay

## 2023-06-04 ENCOUNTER — Other Ambulatory Visit (HOSPITAL_BASED_OUTPATIENT_CLINIC_OR_DEPARTMENT_OTHER): Payer: Self-pay

## 2023-06-04 MED ORDER — ZEPBOUND 12.5 MG/0.5ML ~~LOC~~ SOAJ
12.5000 mg | SUBCUTANEOUS | 0 refills | Status: DC
  Filled 2023-06-04: qty 2, 28d supply, fill #0

## 2023-06-05 DIAGNOSIS — G4733 Obstructive sleep apnea (adult) (pediatric): Secondary | ICD-10-CM | POA: Diagnosis not present

## 2023-06-10 DIAGNOSIS — G4733 Obstructive sleep apnea (adult) (pediatric): Secondary | ICD-10-CM | POA: Diagnosis not present

## 2023-07-06 DIAGNOSIS — G4733 Obstructive sleep apnea (adult) (pediatric): Secondary | ICD-10-CM | POA: Diagnosis not present

## 2023-07-10 ENCOUNTER — Other Ambulatory Visit (HOSPITAL_BASED_OUTPATIENT_CLINIC_OR_DEPARTMENT_OTHER): Payer: Self-pay

## 2023-07-10 MED ORDER — ZEPBOUND 12.5 MG/0.5ML ~~LOC~~ SOAJ
12.5000 mg | SUBCUTANEOUS | 0 refills | Status: DC
  Filled 2023-07-10: qty 2, 28d supply, fill #0

## 2023-07-11 DIAGNOSIS — G4733 Obstructive sleep apnea (adult) (pediatric): Secondary | ICD-10-CM | POA: Diagnosis not present

## 2023-07-11 DIAGNOSIS — Z6833 Body mass index (BMI) 33.0-33.9, adult: Secondary | ICD-10-CM | POA: Diagnosis not present

## 2023-07-11 DIAGNOSIS — E66811 Obesity, class 1: Secondary | ICD-10-CM | POA: Diagnosis not present

## 2023-07-11 DIAGNOSIS — E7800 Pure hypercholesterolemia, unspecified: Secondary | ICD-10-CM | POA: Diagnosis not present

## 2023-07-11 DIAGNOSIS — E65 Localized adiposity: Secondary | ICD-10-CM | POA: Diagnosis not present

## 2023-07-11 DIAGNOSIS — R5383 Other fatigue: Secondary | ICD-10-CM | POA: Diagnosis not present

## 2023-07-19 DIAGNOSIS — M79641 Pain in right hand: Secondary | ICD-10-CM | POA: Diagnosis not present

## 2023-07-19 DIAGNOSIS — S52614A Nondisplaced fracture of right ulna styloid process, initial encounter for closed fracture: Secondary | ICD-10-CM | POA: Diagnosis not present

## 2023-07-25 DIAGNOSIS — Z1329 Encounter for screening for other suspected endocrine disorder: Secondary | ICD-10-CM | POA: Diagnosis not present

## 2023-07-25 DIAGNOSIS — Z13820 Encounter for screening for osteoporosis: Secondary | ICD-10-CM | POA: Diagnosis not present

## 2023-07-25 DIAGNOSIS — Z1321 Encounter for screening for nutritional disorder: Secondary | ICD-10-CM | POA: Diagnosis not present

## 2023-07-25 DIAGNOSIS — Z13220 Encounter for screening for lipoid disorders: Secondary | ICD-10-CM | POA: Diagnosis not present

## 2023-07-25 DIAGNOSIS — Z131 Encounter for screening for diabetes mellitus: Secondary | ICD-10-CM | POA: Diagnosis not present

## 2023-07-25 DIAGNOSIS — Z6834 Body mass index (BMI) 34.0-34.9, adult: Secondary | ICD-10-CM | POA: Diagnosis not present

## 2023-07-25 DIAGNOSIS — Z13228 Encounter for screening for other metabolic disorders: Secondary | ICD-10-CM | POA: Diagnosis not present

## 2023-07-25 DIAGNOSIS — Z01419 Encounter for gynecological examination (general) (routine) without abnormal findings: Secondary | ICD-10-CM | POA: Diagnosis not present

## 2023-07-25 DIAGNOSIS — Z1231 Encounter for screening mammogram for malignant neoplasm of breast: Secondary | ICD-10-CM | POA: Diagnosis not present

## 2023-08-02 ENCOUNTER — Other Ambulatory Visit (HOSPITAL_BASED_OUTPATIENT_CLINIC_OR_DEPARTMENT_OTHER): Payer: Self-pay

## 2023-08-02 MED ORDER — ZEPBOUND 12.5 MG/0.5ML ~~LOC~~ SOAJ
12.5000 mg | SUBCUTANEOUS | 0 refills | Status: DC
  Filled 2023-08-02: qty 2, 28d supply, fill #0

## 2023-08-05 DIAGNOSIS — G4733 Obstructive sleep apnea (adult) (pediatric): Secondary | ICD-10-CM | POA: Diagnosis not present

## 2023-08-22 ENCOUNTER — Other Ambulatory Visit (HOSPITAL_BASED_OUTPATIENT_CLINIC_OR_DEPARTMENT_OTHER): Payer: Self-pay

## 2023-08-22 DIAGNOSIS — E66811 Obesity, class 1: Secondary | ICD-10-CM | POA: Diagnosis not present

## 2023-08-22 DIAGNOSIS — G4733 Obstructive sleep apnea (adult) (pediatric): Secondary | ICD-10-CM | POA: Diagnosis not present

## 2023-08-22 DIAGNOSIS — E65 Localized adiposity: Secondary | ICD-10-CM | POA: Diagnosis not present

## 2023-08-22 DIAGNOSIS — R5383 Other fatigue: Secondary | ICD-10-CM | POA: Diagnosis not present

## 2023-08-22 DIAGNOSIS — Z6833 Body mass index (BMI) 33.0-33.9, adult: Secondary | ICD-10-CM | POA: Diagnosis not present

## 2023-08-22 DIAGNOSIS — E7800 Pure hypercholesterolemia, unspecified: Secondary | ICD-10-CM | POA: Diagnosis not present

## 2023-08-22 MED ORDER — ZEPBOUND 12.5 MG/0.5ML ~~LOC~~ SOAJ
12.5000 mg | SUBCUTANEOUS | 0 refills | Status: DC
  Filled 2023-11-01: qty 2, 28d supply, fill #0

## 2023-08-29 DIAGNOSIS — M542 Cervicalgia: Secondary | ICD-10-CM | POA: Diagnosis not present

## 2023-09-02 ENCOUNTER — Other Ambulatory Visit (HOSPITAL_BASED_OUTPATIENT_CLINIC_OR_DEPARTMENT_OTHER): Payer: Self-pay

## 2023-09-02 MED ORDER — ZEPBOUND 12.5 MG/0.5ML ~~LOC~~ SOAJ
12.5000 mg | SUBCUTANEOUS | 0 refills | Status: AC
  Filled 2023-09-02: qty 2, 28d supply, fill #0

## 2023-09-25 DIAGNOSIS — Z6833 Body mass index (BMI) 33.0-33.9, adult: Secondary | ICD-10-CM | POA: Diagnosis not present

## 2023-09-25 DIAGNOSIS — Z23 Encounter for immunization: Secondary | ICD-10-CM | POA: Diagnosis not present

## 2023-09-25 DIAGNOSIS — J0140 Acute pansinusitis, unspecified: Secondary | ICD-10-CM | POA: Diagnosis not present

## 2023-09-26 ENCOUNTER — Other Ambulatory Visit (HOSPITAL_BASED_OUTPATIENT_CLINIC_OR_DEPARTMENT_OTHER): Payer: Self-pay

## 2023-09-26 DIAGNOSIS — Z6833 Body mass index (BMI) 33.0-33.9, adult: Secondary | ICD-10-CM | POA: Diagnosis not present

## 2023-09-26 DIAGNOSIS — R5383 Other fatigue: Secondary | ICD-10-CM | POA: Diagnosis not present

## 2023-09-26 DIAGNOSIS — E65 Localized adiposity: Secondary | ICD-10-CM | POA: Diagnosis not present

## 2023-09-26 DIAGNOSIS — E7800 Pure hypercholesterolemia, unspecified: Secondary | ICD-10-CM | POA: Diagnosis not present

## 2023-09-26 DIAGNOSIS — G4733 Obstructive sleep apnea (adult) (pediatric): Secondary | ICD-10-CM | POA: Diagnosis not present

## 2023-09-26 DIAGNOSIS — E66811 Obesity, class 1: Secondary | ICD-10-CM | POA: Diagnosis not present

## 2023-09-26 MED ORDER — ZEPBOUND 12.5 MG/0.5ML ~~LOC~~ SOAJ
12.5000 mg | SUBCUTANEOUS | 0 refills | Status: AC
  Filled 2023-09-26: qty 2, 28d supply, fill #0

## 2023-10-01 DIAGNOSIS — G4733 Obstructive sleep apnea (adult) (pediatric): Secondary | ICD-10-CM | POA: Diagnosis not present

## 2023-11-01 ENCOUNTER — Other Ambulatory Visit (HOSPITAL_BASED_OUTPATIENT_CLINIC_OR_DEPARTMENT_OTHER): Payer: Self-pay

## 2023-11-26 ENCOUNTER — Other Ambulatory Visit (HOSPITAL_BASED_OUTPATIENT_CLINIC_OR_DEPARTMENT_OTHER): Payer: Self-pay

## 2023-11-26 MED ORDER — ZEPBOUND 12.5 MG/0.5ML ~~LOC~~ SOAJ
12.5000 mg | SUBCUTANEOUS | 0 refills | Status: DC
  Filled 2023-11-26: qty 2, 28d supply, fill #0

## 2023-12-12 ENCOUNTER — Other Ambulatory Visit (HOSPITAL_BASED_OUTPATIENT_CLINIC_OR_DEPARTMENT_OTHER): Payer: Self-pay

## 2023-12-12 MED ORDER — ZEPBOUND 12.5 MG/0.5ML ~~LOC~~ SOAJ
12.5000 mg | SUBCUTANEOUS | 0 refills | Status: DC
  Filled 2023-12-12 – 2023-12-26 (×4): qty 2, 28d supply, fill #0

## 2023-12-20 ENCOUNTER — Other Ambulatory Visit (HOSPITAL_BASED_OUTPATIENT_CLINIC_OR_DEPARTMENT_OTHER): Payer: Self-pay

## 2023-12-20 ENCOUNTER — Other Ambulatory Visit (HOSPITAL_COMMUNITY): Payer: Self-pay

## 2023-12-20 ENCOUNTER — Other Ambulatory Visit: Payer: Self-pay

## 2023-12-26 ENCOUNTER — Other Ambulatory Visit (HOSPITAL_BASED_OUTPATIENT_CLINIC_OR_DEPARTMENT_OTHER): Payer: Self-pay

## 2024-01-16 ENCOUNTER — Other Ambulatory Visit (HOSPITAL_BASED_OUTPATIENT_CLINIC_OR_DEPARTMENT_OTHER): Payer: Self-pay

## 2024-01-16 DIAGNOSIS — E66811 Obesity, class 1: Secondary | ICD-10-CM | POA: Diagnosis not present

## 2024-01-16 DIAGNOSIS — E7800 Pure hypercholesterolemia, unspecified: Secondary | ICD-10-CM | POA: Diagnosis not present

## 2024-01-16 DIAGNOSIS — R5383 Other fatigue: Secondary | ICD-10-CM | POA: Diagnosis not present

## 2024-01-16 DIAGNOSIS — E65 Localized adiposity: Secondary | ICD-10-CM | POA: Diagnosis not present

## 2024-01-16 DIAGNOSIS — Z6831 Body mass index (BMI) 31.0-31.9, adult: Secondary | ICD-10-CM | POA: Diagnosis not present

## 2024-01-16 DIAGNOSIS — G4733 Obstructive sleep apnea (adult) (pediatric): Secondary | ICD-10-CM | POA: Diagnosis not present

## 2024-01-16 MED ORDER — ZEPBOUND 12.5 MG/0.5ML ~~LOC~~ SOAJ
12.5000 mg | SUBCUTANEOUS | 1 refills | Status: DC
  Filled 2024-01-16: qty 2, 28d supply, fill #0
  Filled 2024-02-21: qty 2, 28d supply, fill #1

## 2024-01-17 DIAGNOSIS — G4733 Obstructive sleep apnea (adult) (pediatric): Secondary | ICD-10-CM | POA: Diagnosis not present

## 2024-01-21 DIAGNOSIS — Z85828 Personal history of other malignant neoplasm of skin: Secondary | ICD-10-CM | POA: Diagnosis not present

## 2024-01-21 DIAGNOSIS — L821 Other seborrheic keratosis: Secondary | ICD-10-CM | POA: Diagnosis not present

## 2024-01-21 DIAGNOSIS — L578 Other skin changes due to chronic exposure to nonionizing radiation: Secondary | ICD-10-CM | POA: Diagnosis not present

## 2024-01-21 DIAGNOSIS — D225 Melanocytic nevi of trunk: Secondary | ICD-10-CM | POA: Diagnosis not present

## 2024-01-21 DIAGNOSIS — L814 Other melanin hyperpigmentation: Secondary | ICD-10-CM | POA: Diagnosis not present

## 2024-02-21 ENCOUNTER — Other Ambulatory Visit (HOSPITAL_BASED_OUTPATIENT_CLINIC_OR_DEPARTMENT_OTHER): Payer: Self-pay

## 2024-02-22 ENCOUNTER — Other Ambulatory Visit (HOSPITAL_BASED_OUTPATIENT_CLINIC_OR_DEPARTMENT_OTHER): Payer: Self-pay

## 2024-02-27 ENCOUNTER — Other Ambulatory Visit (HOSPITAL_BASED_OUTPATIENT_CLINIC_OR_DEPARTMENT_OTHER): Payer: Self-pay

## 2024-02-27 DIAGNOSIS — E66811 Obesity, class 1: Secondary | ICD-10-CM | POA: Diagnosis not present

## 2024-02-27 DIAGNOSIS — E7800 Pure hypercholesterolemia, unspecified: Secondary | ICD-10-CM | POA: Diagnosis not present

## 2024-02-27 DIAGNOSIS — G4733 Obstructive sleep apnea (adult) (pediatric): Secondary | ICD-10-CM | POA: Diagnosis not present

## 2024-02-27 DIAGNOSIS — Z6830 Body mass index (BMI) 30.0-30.9, adult: Secondary | ICD-10-CM | POA: Diagnosis not present

## 2024-02-27 DIAGNOSIS — E65 Localized adiposity: Secondary | ICD-10-CM | POA: Diagnosis not present

## 2024-02-27 MED ORDER — ZEPBOUND 12.5 MG/0.5ML ~~LOC~~ SOAJ
12.5000 mg | SUBCUTANEOUS | 1 refills | Status: AC
  Filled 2024-02-27 – 2024-03-19 (×2): qty 2, 28d supply, fill #0

## 2024-03-19 ENCOUNTER — Other Ambulatory Visit (HOSPITAL_BASED_OUTPATIENT_CLINIC_OR_DEPARTMENT_OTHER): Payer: Self-pay

## 2024-04-09 ENCOUNTER — Other Ambulatory Visit: Payer: Self-pay

## 2024-04-09 ENCOUNTER — Other Ambulatory Visit (HOSPITAL_BASED_OUTPATIENT_CLINIC_OR_DEPARTMENT_OTHER): Payer: Self-pay

## 2024-04-09 DIAGNOSIS — E65 Localized adiposity: Secondary | ICD-10-CM | POA: Diagnosis not present

## 2024-04-09 DIAGNOSIS — G4733 Obstructive sleep apnea (adult) (pediatric): Secondary | ICD-10-CM | POA: Diagnosis not present

## 2024-04-09 DIAGNOSIS — E66811 Obesity, class 1: Secondary | ICD-10-CM | POA: Diagnosis not present

## 2024-04-09 DIAGNOSIS — E7800 Pure hypercholesterolemia, unspecified: Secondary | ICD-10-CM | POA: Diagnosis not present

## 2024-04-09 DIAGNOSIS — Z6830 Body mass index (BMI) 30.0-30.9, adult: Secondary | ICD-10-CM | POA: Diagnosis not present

## 2024-04-09 MED ORDER — ZEPBOUND 12.5 MG/0.5ML ~~LOC~~ SOAJ
12.5000 mg | SUBCUTANEOUS | 1 refills | Status: AC
  Filled 2024-04-09: qty 2, 28d supply, fill #0

## 2024-04-23 DIAGNOSIS — G4733 Obstructive sleep apnea (adult) (pediatric): Secondary | ICD-10-CM | POA: Diagnosis not present

## 2024-05-12 ENCOUNTER — Other Ambulatory Visit (HOSPITAL_BASED_OUTPATIENT_CLINIC_OR_DEPARTMENT_OTHER): Payer: Self-pay

## 2024-05-12 DIAGNOSIS — H5213 Myopia, bilateral: Secondary | ICD-10-CM | POA: Diagnosis not present

## 2024-05-12 DIAGNOSIS — H10413 Chronic giant papillary conjunctivitis, bilateral: Secondary | ICD-10-CM | POA: Diagnosis not present

## 2024-05-12 DIAGNOSIS — H52203 Unspecified astigmatism, bilateral: Secondary | ICD-10-CM | POA: Diagnosis not present

## 2024-05-12 MED ORDER — TIRZEPATIDE-WEIGHT MANAGEMENT 15 MG/0.5ML ~~LOC~~ SOAJ
15.0000 mg | SUBCUTANEOUS | 0 refills | Status: AC
  Filled 2024-05-12: qty 2, 28d supply, fill #0

## 2024-05-19 ENCOUNTER — Other Ambulatory Visit (HOSPITAL_BASED_OUTPATIENT_CLINIC_OR_DEPARTMENT_OTHER): Payer: Self-pay

## 2024-05-21 DIAGNOSIS — E7800 Pure hypercholesterolemia, unspecified: Secondary | ICD-10-CM | POA: Diagnosis not present

## 2024-05-21 DIAGNOSIS — E65 Localized adiposity: Secondary | ICD-10-CM | POA: Diagnosis not present

## 2024-05-21 DIAGNOSIS — Z6829 Body mass index (BMI) 29.0-29.9, adult: Secondary | ICD-10-CM | POA: Diagnosis not present

## 2024-05-21 DIAGNOSIS — E663 Overweight: Secondary | ICD-10-CM | POA: Diagnosis not present

## 2024-05-21 DIAGNOSIS — Z8639 Personal history of other endocrine, nutritional and metabolic disease: Secondary | ICD-10-CM | POA: Diagnosis not present

## 2024-06-08 ENCOUNTER — Other Ambulatory Visit (HOSPITAL_BASED_OUTPATIENT_CLINIC_OR_DEPARTMENT_OTHER): Payer: Self-pay

## 2024-06-08 MED ORDER — ZEPBOUND 15 MG/0.5ML ~~LOC~~ SOAJ
15.0000 mg | SUBCUTANEOUS | 0 refills | Status: AC
  Filled 2024-06-08: qty 2, 28d supply, fill #0

## 2024-06-09 DIAGNOSIS — G4733 Obstructive sleep apnea (adult) (pediatric): Secondary | ICD-10-CM | POA: Diagnosis not present

## 2024-06-18 ENCOUNTER — Other Ambulatory Visit (HOSPITAL_BASED_OUTPATIENT_CLINIC_OR_DEPARTMENT_OTHER): Payer: Self-pay

## 2024-07-16 DIAGNOSIS — Z6829 Body mass index (BMI) 29.0-29.9, adult: Secondary | ICD-10-CM | POA: Diagnosis not present

## 2024-07-16 DIAGNOSIS — Z8639 Personal history of other endocrine, nutritional and metabolic disease: Secondary | ICD-10-CM | POA: Diagnosis not present

## 2024-07-16 DIAGNOSIS — G4733 Obstructive sleep apnea (adult) (pediatric): Secondary | ICD-10-CM | POA: Diagnosis not present

## 2024-07-16 DIAGNOSIS — E7800 Pure hypercholesterolemia, unspecified: Secondary | ICD-10-CM | POA: Diagnosis not present

## 2024-07-16 DIAGNOSIS — E65 Localized adiposity: Secondary | ICD-10-CM | POA: Diagnosis not present

## 2024-07-16 DIAGNOSIS — E663 Overweight: Secondary | ICD-10-CM | POA: Diagnosis not present

## 2024-09-03 ENCOUNTER — Other Ambulatory Visit: Payer: Self-pay | Admitting: Obstetrics and Gynecology

## 2024-09-03 DIAGNOSIS — R928 Other abnormal and inconclusive findings on diagnostic imaging of breast: Secondary | ICD-10-CM

## 2024-09-04 ENCOUNTER — Ambulatory Visit
Admission: RE | Admit: 2024-09-04 | Discharge: 2024-09-04 | Disposition: A | Source: Ambulatory Visit | Attending: Obstetrics and Gynecology | Admitting: Obstetrics and Gynecology

## 2024-09-04 ENCOUNTER — Other Ambulatory Visit: Payer: Self-pay | Admitting: Obstetrics and Gynecology

## 2024-09-04 DIAGNOSIS — R928 Other abnormal and inconclusive findings on diagnostic imaging of breast: Secondary | ICD-10-CM

## 2024-09-07 ENCOUNTER — Encounter

## 2024-09-14 ENCOUNTER — Encounter
